# Patient Record
Sex: Male | Born: 1990 | Race: Black or African American | Hispanic: No | Marital: Single | Smoking: Current every day smoker
Health system: Southern US, Community
[De-identification: ages and names within clinical notes are randomized; demographics above are authoritative.]

## PROBLEM LIST (undated history)

## (undated) DIAGNOSIS — F319 Bipolar disorder, unspecified: Secondary | ICD-10-CM

## (undated) DIAGNOSIS — F209 Schizophrenia, unspecified: Secondary | ICD-10-CM

## (undated) DIAGNOSIS — M6282 Rhabdomyolysis: Secondary | ICD-10-CM

## (undated) HISTORY — PX: LEG SURGERY: SHX1003

---
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
## 2009-06-17 ENCOUNTER — Emergency Department (HOSPITAL_COMMUNITY): Admission: EM | Admit: 2009-06-17 | Discharge: 2009-06-18 | Payer: Self-pay | Admitting: Emergency Medicine

## 2010-12-13 LAB — URINE CULTURE
Colony Count: NO GROWTH
Culture: NO GROWTH

## 2010-12-13 LAB — URINALYSIS, ROUTINE W REFLEX MICROSCOPIC
Glucose, UA: 100 mg/dL — AB
Ketones, ur: 15 mg/dL — AB
Nitrite: NEGATIVE
Specific Gravity, Urine: >1.03 — ABNORMAL HIGH (ref 1.005–1.030)
pH: 6 (ref 5.0–8.0)

## 2010-12-13 LAB — RAPID URINE DRUG SCREEN, HOSP PERFORMED
Benzodiazepines: NOT DETECTED
Cocaine: NOT DETECTED
Opiates: NOT DETECTED
Tetrahydrocannabinol: POSITIVE — AB

## 2010-12-13 LAB — BASIC METABOLIC PANEL
BUN: 10 mg/dL (ref 6–23)
Calcium: 9.2 mg/dL (ref 8.4–10.5)
GFR calc non Af Amer: >60 mL/min (ref >60)
Potassium: 3.2 mEq/L — ABNORMAL LOW (ref 3.5–5.1)
Sodium: 135 mEq/L (ref 135–145)

## 2010-12-13 LAB — URINE MICROSCOPIC-ADD ON

## 2010-12-13 LAB — ETHANOL: Alcohol, Ethyl (B): <5 mg/dL (ref 0–10)

## 2010-12-13 LAB — CBC
HCT: 46.9 % (ref 39.0–52.0)
Hemoglobin: 16.1 g/dL (ref 13.0–17.0)
Platelets: 233 10*3/uL (ref 150–400)
RDW: 13.7 % (ref 11.5–15.5)
WBC: 6.3 10*3/uL (ref 4.0–10.5)

## 2010-12-13 LAB — DIFFERENTIAL
Basophils Absolute: 0.1 10*3/uL (ref 0.0–0.1)
Eosinophils Relative: 0 % (ref 0–5)
Lymphocytes Relative: 39 % (ref 12–46)
Lymphs Abs: 2.4 10*3/uL (ref 0.7–4.0)
Neutro Abs: 3.3 10*3/uL (ref 1.7–7.7)
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
## 2011-06-18 LAB — BASIC METABOLIC PANEL
BUN: 12
Calcium: 9.2
Creatinine, Ser: 1.11
GFR calc non Af Amer: >60
Glucose, Bld: 107 — ABNORMAL HIGH
Potassium: 4.2

## 2011-06-18 LAB — DIFFERENTIAL
Basophils Absolute: 0
Eosinophils Relative: 0
Lymphocytes Relative: 17
Lymphs Abs: 1.6
Neutro Abs: 6.9
Neutrophils Relative %: 76

## 2011-06-18 LAB — CBC
Platelets: 263
RDW: 13.6
WBC: 9.1

## 2011-08-11 ENCOUNTER — Emergency Department (HOSPITAL_COMMUNITY)
Admission: EM | Admit: 2011-08-11 | Discharge: 2011-08-11 | Discharge status: Against Medical Advice | Payer: Self-pay | Source: Home / Self Care

## 2012-03-14 ENCOUNTER — Emergency Department (HOSPITAL_COMMUNITY)
Admission: EM | Admit: 2012-03-14 | Discharge: 2012-03-14 | Disposition: A | Discharge status: Home / Self Care | Payer: Self-pay | Attending: Emergency Medicine | Admitting: Emergency Medicine

## 2012-03-14 ENCOUNTER — Encounter (HOSPITAL_COMMUNITY): Payer: Self-pay | Admitting: *Deleted

## 2012-03-14 DIAGNOSIS — F319 Bipolar disorder, unspecified: Secondary | ICD-10-CM | POA: Insufficient documentation

## 2012-03-14 DIAGNOSIS — F209 Schizophrenia, unspecified: Secondary | ICD-10-CM | POA: Insufficient documentation

## 2012-03-14 DIAGNOSIS — F410 Panic disorder [episodic paroxysmal anxiety] without agoraphobia: Secondary | ICD-10-CM | POA: Insufficient documentation

## 2012-03-14 DIAGNOSIS — F17200 Nicotine dependence, unspecified, uncomplicated: Secondary | ICD-10-CM | POA: Insufficient documentation

## 2012-03-14 HISTORY — DX: Bipolar disorder, unspecified: F31.9

## 2012-03-14 HISTORY — DX: Schizophrenia, unspecified: F20.9

## 2012-03-14 LAB — CBC
Hemoglobin: 13.8 g/dL (ref 13.0–17.0)
MCV: 91 fL (ref 78.0–100.0)
Platelets: 251 10*3/uL (ref 150–400)
RBC: 4.31 MIL/uL (ref 4.22–5.81)
WBC: 6.2 10*3/uL (ref 4.0–10.5)

## 2012-03-14 LAB — RAPID URINE DRUG SCREEN, HOSP PERFORMED
Amphetamines: NOT DETECTED
Benzodiazepines: NOT DETECTED
Opiates: NOT DETECTED
Tetrahydrocannabinol: POSITIVE — AB

## 2012-03-14 LAB — BASIC METABOLIC PANEL
CO2: 26 mEq/L (ref 19–32)
Calcium: 9.3 mg/dL (ref 8.4–10.5)
Chloride: 101 mEq/L (ref 96–112)
Glucose, Bld: 146 mg/dL — ABNORMAL HIGH (ref 70–99)
Potassium: 3.5 mEq/L (ref 3.5–5.1)
Sodium: 135 mEq/L (ref 135–145)

## 2012-03-14 LAB — ETHANOL: Alcohol, Ethyl (B): <11 mg/dL (ref 0–11)

## 2012-03-14 MED ORDER — LORAZEPAM 1 MG PO TABS
1.0000 mg | ORAL_TABLET | Freq: Once | ORAL | Status: AC
  Administered 2012-03-14: 1 mg via ORAL
  Filled 2012-03-14: qty 1

## 2012-03-14 MED ORDER — DIPHENHYDRAMINE HCL 25 MG PO CAPS: 50.0000 mg | ORAL_CAPSULE | Freq: Every day | ORAL | Status: DC

## 2012-03-14 MED ORDER — HALOPERIDOL 1 MG PO TABS: 1.0000 mg | ORAL_TABLET | Freq: Every day | ORAL | Status: DC

## 2012-03-14 NOTE — ED Notes (Signed)
Pt states he thinks he may be having panic attacks lately. Symptoms described as heart racing, headache, shaking, decreased appetite, shaking at times. Pt states he has been under a lot of stress lately.

## 2012-03-14 NOTE — BH Assessment (Signed)
Assessment Note   Nathaniel Holt is an 21 y.o. male. Patient voluntarily came into the emergency room seeking assistance due to increased anxiety and stress. He reports that he is not sleeping very well, falling asleep around 3 AM and then waking up around 5 or 6 AM. He reports having dreams in which he is being chased or people are watching him. He also reports a decrease in appetite. He does not want to eat dinner when he returns home at night from work. Family reports he is a little more paranoid, jumping up when there are loud noises and restless. He does have a previous inpatient history at [facility], about 2 years ago, where he was diagnosed with schizophrenia/bipolar d/o per mother. He stated they gave him some medication and he took it for a while, but then he was feeling better, so he stopped taking it. Patient has had some significant stressors recently, including the start of a new, 40 hour per week job, a new baby, which is 2 months old and moving in the past month. He denies suicidal or homicidal ideation. He denies hearing any voices or having any type of visual hallucinations. However, family reports he is more hypervigilant, looking around more and over all just, jumpy.   Education was provided about his mental health diagnosis and the need to be compliant with medications. He does not have medical insurance. Patient is willing to begin medication again. Tele-psych is being requested so patient can possibly be started on a generic anti-psychotic medication and an outpatient referral will be made for the patient to see [facility] next week for follow up.   Axis I: Chronic Paranoid Schizophrenia by history Axis II: Deferred Axis III Leg Surgery Axis IV: Significant recent stressors: new baby, new job and relocation Axis V: 60  Past Medical History:  Past Medical History  Diagnosis Date  . Bipolar disorder   . Schizophrenia     Past Surgical History  Procedure  Date  . Leg surgery     Family History: No family history on file.  Social History:  reports that he has been smoking.  He does not have any smokeless tobacco history on file. He reports that he does not drink alcohol or use illicit drugs.  Additional Social History:     CIWA: CIWA-Ar BP: 137/90 mmHg Pulse Rate: 92  COWS:    Allergies: No Known Allergies  Home Medications:  (Not in a hospital admission)  OB/GYN Status:  No LMP for male patient.  General Assessment Data Location of Assessment: [facility] Assessment: Yes Living Arrangements: Spouse/significant other;Children Can pt return to current living arrangement?: Yes Admission Status: Voluntary Is patient capable of signing voluntary admission?: Yes Transfer from: Home Referral Source: MD  Education Status Is patient currently in school?: No  Risk to self Suicidal Ideation: No Suicidal Intent: No Is patient at risk for suicide?: No Suicidal Plan?: No Access to Means: No What has been your use of drugs/alcohol within the last 12 months?:  (denies) Previous Attempts/Gestures: No Intentional Self Injurious Behavior: None Family Suicide History: No Recent stressful life event(s): Other (Comment) (GF just had baby; just moved to [place]) Persecutory voices/beliefs?: No Depression: No Depression Symptoms: Insomnia;Loss of interest in usual pleasures Substance abuse history and/or treatment for substance abuse?: Yes (hx of THC and ETOH use) Suicide prevention information given to non-admitted patients: Yes  Risk to Others Homicidal Ideation: No Thoughts of Harm to Others: No Current Homicidal Intent:  No Current Homicidal Plan: No Access to Homicidal Means: No History of harm to others?: No Assessment of Violence: None Noted Does patient have access to weapons?: No Criminal Charges Pending?: No Does patient have a court date: No  Psychosis Hallucinations: None noted Delusions: Unspecified  Mental  Status Report Appear/Hygiene: Improved Eye Contact: Fair Motor Activity: Freedom of movement;Restlessness Speech: Logical/coherent;Soft Level of Consciousness: Alert;Restless Mood: Anxious;Preoccupied Affect: Anxious;Blunted;Preoccupied Anxiety Level: Minimal Thought Processes: Relevant Judgement: Unimpaired Orientation: Person;Place;Time;Situation;Appropriate for developmental age Obsessive Compulsive Thoughts/Behaviors: Minimal  Cognitive Functioning Concentration: Decreased Memory: Recent Intact;Remote Intact IQ: Average Insight: Poor Impulse Control: Fair Appetite: Poor Sleep: Decreased Total Hours of Sleep:  (2)  ADLScreening [facility]) Patient's cognitive ability adequate to safely complete daily activities?: Yes Patient able to express need for assistance with ADLs?: Yes Independently performs ADLs?: Yes  Abuse/Neglect [facility]) Physical Abuse: Denies Verbal Abuse: Denies Sexual Abuse: Denies  Prior Inpatient Therapy Prior Inpatient Therapy: Yes Prior Therapy Dates:  (2010) Prior Therapy Facilty/Provider(s):  ([facility]) Reason for Treatment:  (Psychosis)  Prior Outpatient Therapy Prior Outpatient Therapy: No  ADL Screening (condition at time of admission) Patient's cognitive ability adequate to safely complete daily activities?: Yes Patient able to express need for assistance with ADLs?: Yes Independently performs ADLs?: Yes       Abuse/Neglect Assessment (Assessment to be complete while patient is alone) Physical Abuse: Denies Verbal Abuse: Denies Sexual Abuse: Denies Values / Beliefs Cultural Requests During Hospitalization: None Spiritual Requests During Hospitalization: None        Additional Information 1:1 In Past 12 Months?: No CIRT Risk: No Elopement Risk: No Does patient have medical clearance?: Yes     Disposition:  Disposition Disposition of Patient: Outpatient treatment Type of outpatient treatment: Adult  On  Site Evaluation by:  Dr. Clarene Duke Reviewed with Physician:  Dr. Clarene Duke  Patient will be seen by tele-psychiatrist. Follow-up with [facility] for monitoring and mediation management.   Shon Baton H 03/14/2012 5:20 PM

## 2012-03-14 NOTE — ED Notes (Signed)
Pt stable at discharge Denies HI and SI thoughts Understands follow up procedures

## 2012-03-14 NOTE — ED Notes (Signed)
Tele-psych consult in process.

## 2012-03-14 NOTE — ED Provider Notes (Signed)
History   This chart was scribed for Nathaniel Anger, DO by Charolett Bumpers . The patient was seen in room APA01/APA01.    CSN: [id number]  Arrival date & time 03/14/12  1524   First MD Initiated Contact with Patient 03/14/12 1614      Chief Complaint  Patient presents with  . Panic Attack     HPI Pt was seen at 1527. Nathaniel Holt is a 21 y.o. male who presents to the Emergency Department complaining of gradual onset and persistence of intermittent "anxiety" and "panic attacks" for the past 5 days. Patient describes his symptoms as feeling "shakey," palpatations, decreased appetite, and insomnia. Patient states that he "has been under a lot of stress lately."  Family member states pt goes to work during the day, but when he gets home, and on his days off, he is "paranoid" that "someone is watching him," and "jumpy."  No auditory or visual hallucinations, no SI/HI.  Last psych admit in 2010 to [facility], and he followed up with [facility] for a short period of time afterwards.  Pt has since stopped going to see his mental health counselor and stopped taking his meds.  Denies any other complaints.     Past Medical History  Diagnosis Date  . Bipolar disorder   . Schizophrenia     Past Surgical History  Procedure Date  . Leg surgery     History  Substance Use Topics  . Smoking status: Current Everyday Smoker  . Smokeless tobacco: Not on file  . Alcohol Use: No    Review of Systems ROS: Statement: All systems negative except as marked or noted in the HPI; Constitutional: Negative for fever and chills. ; ; Eyes: Negative for eye pain, redness and discharge. ; ; ENMT: Negative for ear pain, hoarseness, nasal congestion, sinus pressure and sore throat. ; ; Cardiovascular: Negative for chest pain, palpitations, diaphoresis, dyspnea and peripheral edema. ; ; Respiratory: Negative for cough, wheezing and stridor. ; ; Gastrointestinal: Negative for nausea, vomiting,  diarrhea, abdominal pain, blood in stool, hematemesis, jaundice and rectal bleeding. . ; ; Genitourinary: Negative for dysuria, flank pain and hematuria. ; ; Musculoskeletal: Negative for back pain and neck pain. Negative for swelling and trauma.; ; Skin: Negative for pruritus, rash, abrasions, blisters, bruising and skin lesion.; ; Neuro: Negative for headache, lightheadedness and neck stiffness. Negative for weakness, altered level of consciousness , altered mental status, extremity weakness, paresthesias, involuntary movement, seizure and syncope.; Psych:  +anxiety. No SI, no SA, no HI, no hallucinations.      Allergies  Review of patient's allergies indicates no known allergies.  Home Medications  No current outpatient prescriptions on file.  BP 137/90  Pulse 92  Temp 99.1 F (37.3 C) (Oral)  Resp 18  Ht 5\' 9"  (1.753 m)  Wt 155 lb (70.308 kg)  BMI 22.89 kg/m2  SpO2 99%  Physical Exam 1630: Physical examination:  Nursing notes reviewed; Vital signs and O2 SAT reviewed;  Constitutional: Well developed, Well nourished, Well hydrated, In no acute distress; Head:  Normocephalic, atraumatic; Eyes: EOMI, PERRL, No scleral icterus; ENMT: Mouth and pharynx normal, Mucous membranes moist; Neck: Supple, Full range of motion, No lymphadenopathy; Cardiovascular: Regular rate and rhythm, No murmur, rub, or gallop; Respiratory: Breath sounds clear & equal bilaterally, No rales, rhonchi, wheezes.  Speaking full sentences with ease, Normal respiratory effort/excursion; Chest: Nontender, Movement normal; Abdomen: Soft, Nontender, Nondistended, Normal bowel sounds;; Extremities: Pulses normal, No tenderness, No edema,  No calf edema or asymmetry.; Neuro: AA&Ox3, Major CN grossly intact.  Speech clear. No gross focal motor or sensory deficits in extremities.; Skin: Color normal, Warm, Dry.; Psych:  Affect flat, poor eye contact. Denies SI, no hallucinations.    ED Course  Procedures    MDM   MDM Reviewed: previous chart, nursing note and vitals Interpretation: labs   Results for orders placed during the hospital encounter of 03/14/12  BASIC METABOLIC PANEL      Component Value Range   Sodium 135  135 - 145 mEq/L   Potassium 3.5  3.5 - 5.1 mEq/L   Chloride 101  96 - 112 mEq/L   CO2 26  19 - 32 mEq/L   Glucose, Bld 146 (*) 70 - 99 mg/dL   BUN 7  6 - 23 mg/dL   Creatinine, Ser 1.61  0.50 - 1.35 mg/dL   Calcium 9.3  8.4 - 09.6 mg/dL   GFR calc non Af Amer >90  >90 mL/min   GFR calc Af Amer >90  >90 mL/min  CBC      Component Value Range   WBC 6.2  4.0 - 10.5 K/uL   RBC 4.31  4.22 - 5.81 MIL/uL   Hemoglobin 13.8  13.0 - 17.0 g/dL   HCT 04.5  40.9 - 81.1 %   MCV 91.0  78.0 - 100.0 fL   MCH 32.0  26.0 - 34.0 pg   MCHC 35.2  30.0 - 36.0 g/dL   RDW 91.4  78.2 - 95.6 %   Platelets 251  150 - 400 K/uL  URINE RAPID DRUG SCREEN (HOSP PERFORMED)      Component Value Range   Opiates NONE DETECTED  NONE DETECTED   Cocaine NONE DETECTED  NONE DETECTED   Benzodiazepines NONE DETECTED  NONE DETECTED   Amphetamines NONE DETECTED  NONE DETECTED   Tetrahydrocannabinol POSITIVE (*) NONE DETECTED   Barbiturates NONE DETECTED  NONE DETECTED  ETHANOL      Component Value Range   Alcohol, Ethyl (B) <11  0 - 11 mg/dL     2130:  [facility] Felissa has eval, no criteria to admit at this time; will have pt f/u [facility] on Wednesday.  Telepsych MD Trisha Mangle has also eval pt: no criteria to admit at this time, pt is not suicidal/homicidal or actively hallucinating; recommends d/c home with outpt f/u as arranged by [facility], recommends to start benadryl 50mg  PO qpm and haldol 1mg  PO qpm.  Dx testing d/w pt and family.  Questions answered.  Verb understanding, agreeable to d/c home with outpt f/u.         I personally performed the services described in this documentation, which was scribed in my presence. The recorded information has been reviewed and considered. Bryndon Cumbie  Allison Quarry, DO 03/16/12 1947

## 2014-06-17 ENCOUNTER — Emergency Department (HOSPITAL_COMMUNITY)
Admission: EM | Admit: 2014-06-17 | Discharge: 2014-06-17 | Disposition: A | Discharge status: Home / Self Care | Payer: Self-pay | Attending: Emergency Medicine | Admitting: Emergency Medicine

## 2014-06-17 ENCOUNTER — Encounter (HOSPITAL_COMMUNITY): Payer: Self-pay | Admitting: Emergency Medicine

## 2014-06-17 ENCOUNTER — Emergency Department (HOSPITAL_COMMUNITY): Payer: Self-pay

## 2014-06-17 DIAGNOSIS — M79604 Pain in right leg: Secondary | ICD-10-CM | POA: Insufficient documentation

## 2014-06-17 DIAGNOSIS — Z720 Tobacco use: Secondary | ICD-10-CM | POA: Insufficient documentation

## 2014-06-17 DIAGNOSIS — Z8659 Personal history of other mental and behavioral disorders: Secondary | ICD-10-CM | POA: Insufficient documentation

## 2014-06-17 DIAGNOSIS — R609 Edema, unspecified: Secondary | ICD-10-CM

## 2014-06-17 DIAGNOSIS — M7989 Other specified soft tissue disorders: Secondary | ICD-10-CM | POA: Insufficient documentation

## 2014-06-17 MED ORDER — IBUPROFEN 800 MG PO TABS
800.0000 mg | ORAL_TABLET | Freq: Once | ORAL | Status: AC
  Administered 2014-06-17: 800 mg via ORAL
  Filled 2014-06-17: qty 1

## 2014-06-17 MED ORDER — IBUPROFEN 600 MG PO TABS: 600.0000 mg | ORAL_TABLET | Freq: Four times a day (QID) | ORAL | Status: DC | PRN

## 2014-06-17 NOTE — ED Provider Notes (Signed)
Medical screening examination/treatment/procedure(s) were performed by non-physician practitioner and as supervising physician I was immediately available for consultation/collaboration.   EKG Interpretation None        [name], MD 06/17/14 1446

## 2014-06-17 NOTE — ED Notes (Signed)
Pain rt lower leg,x 1 week
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
## 2014-06-17 NOTE — Discharge Instructions (Signed)
Musculoskeletal Pain °Musculoskeletal pain is muscle and boney aches and pains. These pains can occur in any part of the body. Your caregiver may treat you without knowing the cause of the pain. They may treat you if blood or urine tests, X-rays, and other tests were normal.  °CAUSES °There is often not a definite cause or reason for these pains. These pains may be caused by a type of germ (virus). The discomfort may also come from overuse. Overuse includes working out too hard when your body is not fit. Boney aches also come from weather changes. Bone is sensitive to atmospheric pressure changes. °HOME CARE INSTRUCTIONS  °· Ask when your test results will be ready. Make sure you get your test results. °· Only take over-the-counter or prescription medicines for pain, discomfort, or fever as directed by your caregiver. If you were given medications for your condition, do not drive, operate machinery or power tools, or sign legal documents for 24 hours. Do not drink alcohol. Do not take sleeping pills or other medications that may interfere with treatment. °· Continue all activities unless the activities cause more pain. When the pain lessens, slowly resume normal activities. Gradually increase the intensity and duration of the activities or exercise. °· During periods of severe pain, bed rest may be helpful. Lay or sit in any position that is comfortable. °· Putting ice on the injured area. °¨ Put ice in a bag. °¨ Place a towel between your skin and the bag. °¨ Leave the ice on for 15 to 20 minutes, 3 to 4 times a day. °· Follow up with your caregiver for continued problems and no reason can be found for the pain. If the pain becomes worse or does not go away, it may be necessary to repeat tests or do additional testing. Your caregiver may need to look further for a possible cause. °SEEK IMMEDIATE MEDICAL CARE IF: °· You have pain that is getting worse and is not relieved by medications. °· You develop chest pain  that is associated with shortness or breath, sweating, feeling sick to your stomach (nauseous), or throw up (vomit). °· Your pain becomes localized to the abdomen. °· You develop any new symptoms that seem different or that concern you. °MAKE SURE YOU:  °· Understand these instructions. °· Will watch your condition. °· Will get help right away if you are not doing well or get worse. °Document Released: 08/26/2005 Document Revised: 11/18/2011 Document Reviewed: 04/30/2013 °ExitCare® Patient Information ©2015 [employer]. This information is not intended to replace advice given to you by your health care provider. Make sure you discuss any questions you have with your health care provider. ° ° ° ° °Emergency Department Resource Guide °1) Find a Doctor and Pay Out of Pocket °Although you won't have to find out who is covered by your insurance plan, it is a good idea to ask around and get recommendations. You will then need to call the office and see if the doctor you have chosen will accept you as a new patient and what types of options they offer for patients who are self-pay. Some doctors offer discounts or will set up payment plans for their patients who do not have insurance, but you will need to ask so you aren't surprised when you get to your appointment. ° °2) Contact Your Local Health Department °Not all health departments have doctors that can see patients for sick visits, but many do, so it is worth a call to see if   yours does. If you don't know where your local health department is, you can check in your phone book. The CDC also has a tool to help you locate your state's health department, and many state websites also have listings of all of their local health departments. ° °3) Find a Walk-in Clinic °If your illness is not likely to be very severe or complicated, you may want to try a walk in clinic. These are popping up all over the country in pharmacies, drugstores, and shopping centers. They're usually  staffed by nurse practitioners or physician assistants that have been trained to treat common illnesses and complaints. They're usually fairly quick and inexpensive. However, if you have serious medical issues or chronic medical problems, these are probably not your best option. ° °No Primary Care Doctor: °- Call Health Connect at  832-8000 - they can help you locate a primary care doctor that  accepts your insurance, provides certain services, etc. °- Physician Referral Service- 1-800-533-3463 ° °Chronic Pain Problems: °Organization         Address  Phone   Notes  °[facility]  ([phone]) [phone] Patients need to be referred by their primary care doctor.  ° °Medication Assistance: °Organization         Address  Phone   Notes  °[place] [facility] [address] °[place], [place] [address] (336) 641-8030 --Must be a resident of [place] °-- Must have NO insurance coverage whatsoever (no Medicaid/ Medicare, etc.) °-- The pt. MUST have a primary care doctor that directs their care regularly and follows them in the community °  °[facility]  (866) 331-1348   °United Way  (888) 892-1162   ° °Agencies that provide inexpensive medical care: °Organization         Address  Phone   Notes  °[facility]  (336) 832-8035   °[facility]    (336) 832-7272   °[facility] [address] °[place], [place] [address] (336) 832-4777   °[facility] [address], °[place] (336) 271-4999   °[facility]    (336) 373-0678   °[facility]    (336) 272-1050   °[facility] ° [address], [place] Phone:  (336) 832-4444, Fax:  (336) 832-4440 Hours of Operation:  9 am - 6 pm, M-F.  Also accepts Medicaid/Medicare and self-pay.  °[facility] ° [address], [place] Phone: (336) [phone], Fax: (336) [phone]. Hours of  Operation:  8:30 am - 5:30 pm, M-F.  Also accepts Medicaid and self-pay.  °[facility] [place] [address], [place] Phone: (336) 878-6027   °[facility] [address], [place], [place] (336)723-1848, Ext. 123 Mondays & Thursdays: 7-9 AM.  First 15 patients are seen on a first come, first serve basis. °  ° °Medicaid-accepting [place] Providers: ° °Organization         Address  Phone   Notes  °[facility] [address], [place] ([phone]) [phone] Also accepts self-pay patients.  °[facility] [address], [place] ° (336) [phone]   °[facility] [address], [place] (336) 288-8857   °[facility] [address] [place] [address], [place] (336) 299-7000   °Veita Bland [address], [place]  ° (336)   [phone] Only accepts [place] Access Medicaid patients after they have their name applied to their card.  ° °Self-Pay (no insurance) in [place]: ° °Organization         Address  Phone   Notes  °Sickle Cell Patients, [facility] [address], [place] (336) [phone]   °[facility] [address], [place] (336) 832-4400   °[facility] ° [address], [place] (336) 992-4800   °[facility]/Dr. Osei-Bonsu ° [address] [place] [address], [place] or [address], [place] (336) [phone] Phone number for both [place] and [place] locations is the same.  °[facility] [address], [place] (336) 299-0000   °[facility] [address] [place] [address], [place] or [address] (336) 852-7530 °(336) 878-2260   °[facility] [address], [place] (336) 350-1642, phone; (336) 294-5005, fax Sees patients 1st and 3rd Saturday of every month.  Must not qualify for public or private insurance (i.e. Medicaid, Medicare,  [place] Health Choice, Veterans' Benefits) • Household income should be no more than 200% of the poverty level •The clinic cannot treat you if you are pregnant or think you are pregnant • Sexually transmitted diseases are not treated at the clinic.  ° ° °Dental Care: °Organization         Address  Phone  Notes  °[place] [facility] [address], [place] (336) 641-6152 Accepts children up to age 21 who are enrolled in Medicaid or [place] Health Choice; pregnant women with a Medicaid card; and children who have applied for Medicaid or [place] Health Choice, but were declined, whose parents can pay a reduced fee at time of service.  °[place] [place]  [address], [place] (336) 641-7733 Accepts children up to age 21 who are enrolled in Medicaid or [place] Health Choice; pregnant women with a Medicaid card; and children who have applied for Medicaid or [place] Health Choice, but were declined, whose parents can pay a reduced fee at time of service.  °[facility] ° [address], [place] (336) 641-4533 Patients are seen by appointment only. Walk-ins are not accepted. [facility] will see patients 18 years of age and older. °Monday - Tuesday (8am-5pm) °Most Wednesdays (8:30-5pm) °$30 per visit, cash only  °[facility] ° [address], [place] (336) [phone] Patients are seen by appointment only. Walk-ins are not accepted. [facility] will see patients 18 years of age and older. °One Wednesday Evening (Monthly: Volunteer Based).  $30 per visit, cash only  °[facility]  ([phone]) [phone] for adults; Children under age 4, call Graduate Pediatric Dentistry at ([phone]) [phone]. Children aged 4-14, please call ([phone]) [phone] to request a pediatric application. ° Dental services are provided in all areas of dental care including fillings, crowns and bridges,  complete and partial dentures, implants, gum treatment, root canals, and extractions. Preventive care is also provided. Treatment is provided to both adults and children. °Patients are selected via a lottery and there is often a waiting list. °  °[facility] [address], ° ° (336) 763-8833 [email or web address] °  °[facility] [address], [place], [place] (336)723-1848, Ext. 123 Second and Fourth Thursday of each month, opens at 6:30 AM;   Clinic ends at 9 AM.  Patients are seen on a first-come first-served basis, and a limited number are seen during each clinic.   [facility]  [address] Hillard Danker [place], [place] [phone]   Eligibility Requirements You must have lived in [place], [place], or [place] counties for at least the last three months.   You cannot be eligible for state or federal sponsored [employer], including [employer], [place], or [employer].   You generally cannot be eligible for healthcare insurance through your employer.    How to apply: Eligibility screenings are held every Tuesday and Wednesday afternoon from 1:00 pm until 4:00 pm. You do not need an appointment for the interview!  [facility] [address] [place], [place]   [place]  [place] Department  [place]  [phone]    Behavioral Health Resources in the Community: Intensive Outpatient Programs Organization         Address  Phone  Notes  [place]. [address] [place], [place] [phone])[phone]   [facility] [address], [place], [place]   [facility]: [facility] [address], [place], [place]   [place] [address],  [place], [place] or (251)778-4510   Substance Abuse Resources Organization          Address  Phone  Notes  [facility]  (540)363-8552   [place]  (567)824-8078   [facility] [place]   Chinita Pester  [phone]   Residential & Outpatient Substance Abuse Program  (204) 443-1832   Psychological Services Organization         Address  Phone  Notes  [facility] [place]  [place]  [phone]   [place] [address] [place] or [phone]    Mobile Crisis Teams Organization         Address  Phone  Notes  [facility], Mobile Crisis Care Unit  (443) 402-2388   [facility]  [address] [place], [place]   [place] [address], [place] [phone]    Self-Help/Support Groups Organization         Address  Phone             Notes  [place]. of [place] - variety of support groups  [place] Call for more information  Narcotics Anonymous (NA), [facility] [address], [employer] [place]  2 meetings at this location   Special educational needs teacher         Address  Phone  Notes  [facility] [place],    [place]  [phone]   [facility]  [address] [place] [address]1224, [place], [place]   [place], [place] [phone] Admissions: 8am-3pm M-F  [facility] [place] [address]    [place], [place] [phone]   [facility] [address] Jadene Pierini [place], [place]   [facility] [address],  [place], [place]   [facility] - Intensive Outpatient [address] Kristeen [address], [place], [place]   [facility] ([place] [address] [place], [place] or [phone]  [facility] ([facility]) [address] [place], [place]  [phone] Accepts Medicaid  [facility] [address]  [place] [phone] Substance Abuse/Addiction Treatment   [facility] Resources Organization         Address  Phone  Notes  [facility]  [phone]) [phone]   Angie FavaJulie Brannon, PhD [address] [place], [place]   [phone](336) [phone] or ([phone](336) [phone]   [facility]   [address] [place], [place] [phone](336) [phone]   [facility] [address], [place], [place] [phone]) [phone] Insurance/Medicaid/sponsorship through [facility]  [facility] [address]                                    [place], [place] [phone]) [phone] Therapy/tele-psych/case  [facility] [address]   [place], [place] [phone]) [phone]    Dr. Lolly MustacheArfeen  [phone]) [phone]   [facility]  United [facility] 1) [address],  2) [address], [place] 3)  [address], [place] [phone](336) [phone](336) ([phone]  [phone](336) [phone]   [facility] Child Abuse Hotline 825-349-3370(336) 517-725-6717 or [phone](336) [phone] (After Hours)      Use the medicine prescribed along with a heating pad applied to your lower leg.  Elevate and try to avoid prolonged standing or other activity that worsens pain.  Return here for any worsening symptoms.  Your ultrasound is negative today - there is no sign of a blood clot as the source of your symptoms.
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
## 2016-07-17 ENCOUNTER — Emergency Department (HOSPITAL_COMMUNITY)
Admission: EM | Admit: 2016-07-17 | Discharge: 2016-07-17 | Disposition: A | Discharge status: Home / Self Care | Payer: No Typology Code available for payment source | Attending: Emergency Medicine | Admitting: Emergency Medicine

## 2016-07-17 ENCOUNTER — Encounter (HOSPITAL_COMMUNITY): Payer: Self-pay | Admitting: Emergency Medicine

## 2016-07-17 ENCOUNTER — Emergency Department (HOSPITAL_COMMUNITY): Payer: No Typology Code available for payment source

## 2016-07-17 DIAGNOSIS — Y999 Unspecified external cause status: Secondary | ICD-10-CM | POA: Diagnosis not present

## 2016-07-17 DIAGNOSIS — R0781 Pleurodynia: Secondary | ICD-10-CM

## 2016-07-17 DIAGNOSIS — Y92481 Parking lot as the place of occurrence of the external cause: Secondary | ICD-10-CM | POA: Insufficient documentation

## 2016-07-17 DIAGNOSIS — Y939 Activity, unspecified: Secondary | ICD-10-CM | POA: Diagnosis not present

## 2016-07-17 DIAGNOSIS — S59911A Unspecified injury of right forearm, initial encounter: Secondary | ICD-10-CM | POA: Diagnosis present

## 2016-07-17 DIAGNOSIS — S5011XA Contusion of right forearm, initial encounter: Secondary | ICD-10-CM | POA: Diagnosis not present

## 2016-07-17 DIAGNOSIS — F17210 Nicotine dependence, cigarettes, uncomplicated: Secondary | ICD-10-CM | POA: Insufficient documentation

## 2016-07-17 MED ORDER — IBUPROFEN 800 MG PO TABS
800.0000 mg | ORAL_TABLET | Freq: Once | ORAL | Status: AC
  Administered 2016-07-17: 800 mg via ORAL
  Filled 2016-07-17: qty 1
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
## 2017-06-24 ENCOUNTER — Emergency Department (HOSPITAL_COMMUNITY)
Admission: EM | Admit: 2017-06-24 | Discharge: 2017-06-24 | Disposition: A | Discharge status: Home / Self Care | Payer: Self-pay | Attending: Emergency Medicine | Admitting: Emergency Medicine

## 2017-06-24 ENCOUNTER — Encounter (HOSPITAL_COMMUNITY): Payer: Self-pay | Admitting: *Deleted

## 2017-06-24 DIAGNOSIS — R112 Nausea with vomiting, unspecified: Secondary | ICD-10-CM | POA: Insufficient documentation

## 2017-06-24 DIAGNOSIS — R1115 Cyclical vomiting syndrome unrelated to migraine: Secondary | ICD-10-CM

## 2017-06-24 DIAGNOSIS — F17210 Nicotine dependence, cigarettes, uncomplicated: Secondary | ICD-10-CM | POA: Insufficient documentation

## 2017-06-24 MED ORDER — ONDANSETRON 8 MG PO TBDP: 8.0000 mg | ORAL_TABLET | Freq: Three times a day (TID) | ORAL | 0 refills | Status: DC | PRN

## 2017-06-24 MED ORDER — ONDANSETRON 8 MG PO TBDP
8.0000 mg | ORAL_TABLET | Freq: Once | ORAL | Status: AC
  Administered 2017-06-24: 8 mg via ORAL
  Filled 2017-06-24: qty 1

## 2017-06-24 NOTE — Discharge Instructions (Signed)

## 2017-06-24 NOTE — ED Provider Notes (Signed)
[facility] EMERGENCY DEPARTMENT Provider Note   CSN: [id number] Arrival date & time: 06/24/17  0409     History   Chief Complaint Chief Complaint  Patient presents with  . Emesis    HPI Nathaniel Holt is a 26 y.o. male.  The history is provided by the patient.  Emesis   This is a new problem. The current episode started 1 to 2 hours ago. The problem has been gradually worsening. There has been no fever. Associated symptoms include abdominal pain and chills. Pertinent negatives include no diarrhea.  pt reports soon after going to bed he had onset of nausea/vomiting No diarrhea but he feels this might start He has chills No significant abdominal pain He has had mild cough No other acute complaints No travel Past Medical History:  Diagnosis Date  . Bipolar disorder (HCC)   . Schizophrenia (HCC)     There are no active problems to display for this patient.   Past Surgical History:  Procedure Laterality Date  . LEG SURGERY         Home Medications    Prior to Admission medications   Medication Sig Start Date End Date Taking? Authorizing Provider  ibuprofen (ADVIL,MOTRIN) 600 MG tablet Take 1 tablet (600 mg total) by mouth every 6 (six) hours as needed. 06/17/14   Burgess Amor, PA-C    Family History History reviewed. No pertinent family history.  Social History Social History  Substance Use Topics  . Smoking status: Current Every Day Smoker    Packs/day: 1.00    Types: Cigarettes  . Smokeless tobacco: Never Used  . Alcohol use No     Allergies   Patient has no known allergies.   Review of Systems Review of Systems  Constitutional: Positive for chills.  Gastrointestinal: Positive for abdominal pain and vomiting. Negative for diarrhea.  All other systems reviewed and are negative.    Physical Exam Updated Vital Signs BP 116/86   Pulse 81   Temp 98.1 F (36.7 C)   Resp 18   Ht 1.753 m ( )   Wt 68 kg (150 lb)   SpO2 98%   BMI 22.15  kg/m   Physical Exam CONSTITUTIONAL: Well developed/well nourished HEAD: Normocephalic/atraumatic EYES: EOMI/PERRL ENMT: Mucous membranes moist NECK: supple no meningeal signs SPINE/BACK:entire spine nontender CV: S1/S2 noted, no murmurs/rubs/gallops noted LUNGS: Lungs are clear to auscultation bilaterally, no apparent distress ABDOMEN: soft, nontender, no rebound or guarding, bowel sounds noted throughout abdomen GU:no cva tenderness NEURO: Pt is awake/alert/appropriate, moves all extremitiesx4.  No facial droop.   EXTREMITIES: pulses normal/equal, full ROM SKIN: warm, color normal PSYCH: no abnormalities of mood noted, alert and oriented to situation   ED Treatments / Results  Labs (all labs ordered are listed, but only abnormal results are displayed) Labs Reviewed - No data to display  EKG  EKG Interpretation None       Radiology No results found.  Procedures Procedures (including critical care time)  Medications Ordered in ED Medications  ondansetron (ZOFRAN-ODT) disintegrating tablet 8 mg (8 mg Oral Given 06/24/17 0442)     Initial Impression / Assessment and Plan / ED Course  I have reviewed the triage vital signs and the nursing notes.      Pt stable He is well appearing Likely viral illness   No focal ABD tenderness No vomiting here He is appropriate for d/c home Final Clinical Impressions(s) / ED Diagnoses   Final diagnoses:  Non-intractable cyclical vomiting with nausea  New Prescriptions Discharge Medication List as of 06/24/2017  5:28 AM    START taking these medications   Details  ondansetron (ZOFRAN ODT) 8 MG disintegrating tablet Take 1 tablet (8 mg total) by mouth every 8 (eight) hours as needed., Starting Tue 06/24/2017, Print         Zadie Rhine, MD 06/24/17 [phone]

## 2017-06-24 NOTE — ED Triage Notes (Signed)
Pt c/o n/v that woke him up this evening around 0130

## 2017-08-10 ENCOUNTER — Emergency Department (HOSPITAL_COMMUNITY)
Admission: EM | Admit: 2017-08-10 | Discharge: 2017-08-10 | Disposition: A | Discharge status: Home / Self Care | Payer: No Typology Code available for payment source | Attending: Emergency Medicine | Admitting: Emergency Medicine

## 2017-08-10 ENCOUNTER — Encounter (HOSPITAL_COMMUNITY): Payer: Self-pay | Admitting: Emergency Medicine

## 2017-08-10 ENCOUNTER — Other Ambulatory Visit: Payer: Self-pay

## 2017-08-10 DIAGNOSIS — Y929 Unspecified place or not applicable: Secondary | ICD-10-CM | POA: Insufficient documentation

## 2017-08-10 DIAGNOSIS — F17210 Nicotine dependence, cigarettes, uncomplicated: Secondary | ICD-10-CM | POA: Diagnosis not present

## 2017-08-10 DIAGNOSIS — Y999 Unspecified external cause status: Secondary | ICD-10-CM | POA: Diagnosis not present

## 2017-08-10 DIAGNOSIS — S39012A Strain of muscle, fascia and tendon of lower back, initial encounter: Secondary | ICD-10-CM | POA: Insufficient documentation

## 2017-08-10 DIAGNOSIS — Z79899 Other long term (current) drug therapy: Secondary | ICD-10-CM | POA: Diagnosis not present

## 2017-08-10 DIAGNOSIS — Y939 Activity, unspecified: Secondary | ICD-10-CM | POA: Diagnosis not present

## 2017-08-10 DIAGNOSIS — S3992XA Unspecified injury of lower back, initial encounter: Secondary | ICD-10-CM | POA: Diagnosis present

## 2017-08-10 MED ORDER — METHOCARBAMOL 500 MG PO TABS: 500.0000 mg | ORAL_TABLET | Freq: Two times a day (BID) | ORAL | 0 refills | Status: DC

## 2017-08-10 MED ORDER — DICLOFENAC SODIUM 50 MG PO TBEC: 50.0000 mg | DELAYED_RELEASE_TABLET | Freq: Two times a day (BID) | ORAL | 0 refills | Status: DC
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
## 2017-08-10 NOTE — Discharge Instructions (Signed)
Return if any problems.

## 2020-01-21 ENCOUNTER — Encounter (HOSPITAL_COMMUNITY): Payer: Self-pay

## 2020-01-21 ENCOUNTER — Emergency Department (HOSPITAL_COMMUNITY)
Admission: EM | Admit: 2020-01-21 | Discharge: 2020-01-21 | Disposition: A | Discharge status: Home / Self Care | Payer: Self-pay | Attending: Emergency Medicine | Admitting: Emergency Medicine

## 2020-01-21 ENCOUNTER — Other Ambulatory Visit: Payer: Self-pay

## 2020-01-21 DIAGNOSIS — N3000 Acute cystitis without hematuria: Secondary | ICD-10-CM | POA: Insufficient documentation

## 2020-01-21 DIAGNOSIS — F17210 Nicotine dependence, cigarettes, uncomplicated: Secondary | ICD-10-CM | POA: Insufficient documentation

## 2020-01-21 DIAGNOSIS — Z79899 Other long term (current) drug therapy: Secondary | ICD-10-CM | POA: Insufficient documentation

## 2020-01-21 LAB — URINALYSIS, ROUTINE W REFLEX MICROSCOPIC
Bacteria, UA: NONE SEEN
Bilirubin Urine: NEGATIVE
Glucose, UA: NEGATIVE mg/dL
Hgb urine dipstick: NEGATIVE
Ketones, ur: NEGATIVE mg/dL
Leukocytes,Ua: NEGATIVE
Nitrite: POSITIVE — AB
Protein, ur: NEGATIVE mg/dL
Specific Gravity, Urine: 1.015 (ref 1.005–1.030)
pH: 6 (ref 5.0–8.0)

## 2020-01-21 MED ORDER — CEPHALEXIN 500 MG PO CAPS: 500.0000 mg | ORAL_CAPSULE | Freq: Two times a day (BID) | ORAL | 0 refills | Status: AC

## 2020-01-21 MED ORDER — CEPHALEXIN 500 MG PO CAPS
500.0000 mg | ORAL_CAPSULE | Freq: Once | ORAL | Status: AC
  Administered 2020-01-21: 500 mg via ORAL
  Filled 2020-01-21: qty 1

## 2020-01-21 NOTE — ED Triage Notes (Signed)
Pt has been having the urge to urinate more frequently. He [facility] for STD's and tested negative. He believes he has A uti

## 2020-01-21 NOTE — ED Provider Notes (Signed)
[facility] EMERGENCY DEPARTMENT Provider Note   CSN: [id number] Arrival date & time: 01/21/20  3016     History Chief Complaint  Patient presents with  . Urinary Frequency    Nathaniel Holt is a 29 y.o. male presenting for evaluation of dysuria and increased urinary frequency which started about 4 days ago.  He describes burning pain with urination in addition to increased urinary frequency.  He denies n/v, abdominal pain, fevers or chills, no flank pain.  He denies penile discharge. He [facility] [place] ytd and was screened for std's yesterday with negative results.  He states they were unable to run a UA but he reports distant hx of uti.  He is currently taking otc pyridium with significant improvement in his sx.  No personal or family history of kidney stones.   HPI     Past Medical History:  Diagnosis Date  . Bipolar disorder ([place])   . Schizophrenia ([place])     There are no problems to display for this patient.   Past Surgical History:  Procedure Laterality Date  . LEG SURGERY         No family history on file.  Social History   Tobacco Use  . Smoking status: Current Every Day Smoker    Packs/day: 1.00    Types: Cigarettes  . Smokeless tobacco: Never Used  Substance Use Topics  . Alcohol use: No  . Drug use: No    Home Medications Prior to Admission medications   Medication Sig Start Date End Date Taking? Authorizing Provider  cephALEXin (KEFLEX) 500 MG capsule Take 1 capsule (500 mg total) by mouth 2 (two) times daily for 7 days. 01/21/20 01/28/20  Evalee Jefferson, PA-C  diclofenac (VOLTAREN) 50 MG EC tablet Take 1 tablet (50 mg total) by mouth 2 (two) times daily. 08/10/17   Fransico Meadow, PA-C  ibuprofen (ADVIL,MOTRIN) 600 MG tablet Take 1 tablet (600 mg total) by mouth every 6 (six) hours as needed. 06/17/14   Evalee Jefferson, PA-C  methocarbamol (ROBAXIN) 500 MG tablet Take 1 tablet (500 mg total) by mouth 2 (two) times daily. 08/10/17   Fransico Meadow, PA-C  ondansetron (ZOFRAN ODT) 8 MG disintegrating tablet Take 1 tablet (8 mg total) by mouth every 8 (eight) hours as needed. 06/24/17   Ripley Fraise, MD    Allergies    Patient has no known allergies.  Review of Systems   Review of Systems  Constitutional: Negative for chills and fever.  HENT: Negative for congestion.   Eyes: Negative.   Respiratory: Negative for chest tightness and shortness of breath.   Cardiovascular: Negative for chest pain.  Gastrointestinal: Negative for abdominal pain and nausea.  Genitourinary: Positive for dysuria, frequency and urgency. Negative for discharge, flank pain, hematuria, penile pain, penile swelling and testicular pain.  Musculoskeletal: Negative for arthralgias, joint swelling and neck pain.  Skin: Negative.   Neurological: Negative for dizziness, weakness, light-headedness, numbness and headaches.  Psychiatric/Behavioral: Negative.     Physical Exam Updated Vital Signs BP (!) 126/92   Pulse 75   Temp 98.3 F (36.8 C)   Resp 12   Ht 5\' 9"  (1.753 m)   Wt 72.6 kg   SpO2 100%   BMI 23.63 kg/m   Physical Exam Vitals and nursing note reviewed.  Constitutional:      Appearance: He is well-developed.  HENT:     Head: Normocephalic and atraumatic.  Eyes:  Conjunctiva/sclera: Conjunctivae normal.  Cardiovascular:     Rate and Rhythm: Normal rate and regular rhythm.     Heart sounds: Normal heart sounds.  Pulmonary:     Effort: Pulmonary effort is normal.     Breath sounds: Normal breath sounds. No wheezing.  Abdominal:     General: Bowel sounds are normal. There is no distension.     Palpations: Abdomen is soft.     Tenderness: There is no abdominal tenderness. There is no right CVA tenderness, left CVA tenderness or guarding.  Musculoskeletal:        General: Normal range of motion.     Cervical back: Normal range of motion.  Skin:    General: Skin is warm and dry.  Neurological:     Mental Status: He is  alert.     ED Results / Procedures / Treatments   Labs (all labs ordered are listed, but only abnormal results are displayed) Labs Reviewed  URINALYSIS, ROUTINE W REFLEX MICROSCOPIC - Abnormal; Notable for the following components:      Result Value   Color, Urine AMBER (*)    Nitrite POSITIVE (*)    All other components within normal limits  URINE CULTURE  GC/CHLAMYDIA PROBE AMP ([place]) NOT AT [facility]    EKG None  Radiology No results found.  Procedures Procedures (including critical care time)  Medications Ordered in ED Medications  cephALEXin (KEFLEX) capsule 500 mg (has no administration in time range)    ED Course  I have reviewed the triage vital signs and the nursing notes.  Pertinent labs & imaging results that were available during my care of the patient were reviewed by me and considered in my medical decision making (see chart for details).    MDM Rules/Calculators/A&P                      Patient with dysuria and increased urinary frequency, classic for simple UTI.  He is nitrite positive today suggesting that this is a UTI.  He had a negative GC chlamydia test yesterday and he denies risk factors for STDs, denies penile discharge.  GC chlamydia was recollected today to confirm yesterday's test results, but will defer to this culture results.  He was started on Keflex with first dose given here.  Encouraged increased fluid intake.  Return precautions were outlined including fevers, nausea or vomiting, worsened symptoms. Final Clinical Impression(s) / ED Diagnoses Final diagnoses:  Acute cystitis without hematuria    Rx / DC Orders ED Discharge Orders         Ordered    cephALEXin (KEFLEX) 500 MG capsule  2 times daily     01/21/20 1029           Burgess Amor, PA-C 01/21/20 1034    Long, Arlyss Repress, MD 01/24/20 [phone]

## 2020-01-21 NOTE — Discharge Instructions (Addendum)
Your symptoms and labs today suggest that you do have a urinary infection.  Take your next dose of the antibiotic this evening.  Make sure you are drinking plenty of fluids.  Get rechecked if your symptoms persist or worsen (including any development of fever, nausea or vomiting).  A urine culture has been sent which will help [place] if your symptoms do not improve with the antibiotic chosen.  Also, a gonorrhea and chlamydia test has been sent from your urine sample to ensure your test results from yesterday were correct.

## 2020-01-23 LAB — URINE CULTURE: Culture: <10000 — AB

## 2020-01-24 LAB — GC/CHLAMYDIA PROBE AMP (~~LOC~~) NOT AT ARMC
Chlamydia: NEGATIVE
Comment: NEGATIVE
Comment: NORMAL
Neisseria Gonorrhea: NEGATIVE

## 2020-02-10 ENCOUNTER — Encounter (HOSPITAL_COMMUNITY): Payer: Self-pay | Admitting: Emergency Medicine

## 2020-02-10 ENCOUNTER — Emergency Department (HOSPITAL_COMMUNITY): Payer: Self-pay

## 2020-02-10 ENCOUNTER — Inpatient Hospital Stay (HOSPITAL_COMMUNITY)
Admission: EM | Admit: 2020-02-10 | Discharge: 2020-02-13 | DRG: 558 | Discharge status: Against Medical Advice | Payer: Self-pay | Attending: Internal Medicine | Admitting: Internal Medicine

## 2020-02-10 ENCOUNTER — Other Ambulatory Visit: Payer: Self-pay

## 2020-02-10 DIAGNOSIS — F17210 Nicotine dependence, cigarettes, uncomplicated: Secondary | ICD-10-CM | POA: Diagnosis present

## 2020-02-10 DIAGNOSIS — S29011A Strain of muscle and tendon of front wall of thorax, initial encounter: Secondary | ICD-10-CM | POA: Diagnosis present

## 2020-02-10 DIAGNOSIS — R072 Precordial pain: Secondary | ICD-10-CM

## 2020-02-10 DIAGNOSIS — Y93B2 Activity, push-ups, pull-ups, sit-ups: Secondary | ICD-10-CM

## 2020-02-10 DIAGNOSIS — M6282 Rhabdomyolysis: Principal | ICD-10-CM | POA: Diagnosis present

## 2020-02-10 DIAGNOSIS — F209 Schizophrenia, unspecified: Secondary | ICD-10-CM | POA: Diagnosis present

## 2020-02-10 DIAGNOSIS — Z20822 Contact with and (suspected) exposure to covid-19: Secondary | ICD-10-CM | POA: Diagnosis present

## 2020-02-10 LAB — CBC
HCT: 39.6 % (ref 39.0–52.0)
Hemoglobin: 13.3 g/dL (ref 13.0–17.0)
MCH: 32 pg (ref 26.0–34.0)
MCHC: 33.6 g/dL (ref 30.0–36.0)
MCV: 95.4 fL (ref 80.0–100.0)
Platelets: 229 10*3/uL (ref 150–400)
RBC: 4.15 MIL/uL — ABNORMAL LOW (ref 4.22–5.81)
RDW: 13.2 % (ref 11.5–15.5)
WBC: 4.6 10*3/uL (ref 4.0–10.5)
nRBC: 0 % (ref 0.0–0.2)

## 2020-02-10 LAB — BASIC METABOLIC PANEL
Anion gap: 10 (ref 5–15)
BUN: 9 mg/dL (ref 6–20)
CO2: 25 mmol/L (ref 22–32)
Calcium: 8.7 mg/dL — ABNORMAL LOW (ref 8.9–10.3)
Chloride: 102 mmol/L (ref 98–111)
Creatinine, Ser: 0.92 mg/dL (ref 0.61–1.24)
GFR calc Af Amer: >60 mL/min (ref >60)
GFR calc non Af Amer: >60 mL/min (ref >60)
Glucose, Bld: 102 mg/dL — ABNORMAL HIGH (ref 70–99)
Potassium: 3.6 mmol/L (ref 3.5–5.1)
Sodium: 137 mmol/L (ref 135–145)

## 2020-02-10 LAB — CK: Total CK: 20251 U/L — ABNORMAL HIGH (ref 49–397)

## 2020-02-10 LAB — RAPID URINE DRUG SCREEN, HOSP PERFORMED
Amphetamines: NOT DETECTED
Barbiturates: NOT DETECTED
Benzodiazepines: NOT DETECTED
Cocaine: NOT DETECTED
Opiates: NOT DETECTED
Tetrahydrocannabinol: POSITIVE — AB

## 2020-02-10 LAB — HIV ANTIBODY (ROUTINE TESTING W REFLEX): HIV Screen 4th Generation wRfx: NONREACTIVE

## 2020-02-10 LAB — SARS CORONAVIRUS 2 BY RT PCR (HOSPITAL ORDER, PERFORMED IN ~~LOC~~ HOSPITAL LAB): SARS Coronavirus 2: NEGATIVE

## 2020-02-10 MED ORDER — NICOTINE 7 MG/24HR TD PT24
7.0000 mg | MEDICATED_PATCH | Freq: Every day | TRANSDERMAL | Status: DC
  Administered 2020-02-10 – 2020-02-12 (×3): 7 mg via TRANSDERMAL
  Filled 2020-02-10 (×4): qty 1

## 2020-02-10 MED ORDER — SODIUM CHLORIDE 0.9 % IV SOLN: INTRAVENOUS | Status: DC

## 2020-02-10 MED ORDER — OXYCODONE HCL 5 MG PO TABS
5.0000 mg | ORAL_TABLET | ORAL | Status: DC | PRN
  Administered 2020-02-10 – 2020-02-11 (×2): 5 mg via ORAL
  Filled 2020-02-10 (×2): qty 1

## 2020-02-10 MED ORDER — ONDANSETRON HCL 4 MG/2ML IJ SOLN: 4.0000 mg | Freq: Four times a day (QID) | INTRAMUSCULAR | Status: DC | PRN

## 2020-02-10 MED ORDER — KETOROLAC TROMETHAMINE 30 MG/ML IJ SOLN: 30.0000 mg | Freq: Four times a day (QID) | INTRAMUSCULAR | Status: DC | PRN

## 2020-02-10 MED ORDER — ONDANSETRON HCL 4 MG PO TABS: 4.0000 mg | ORAL_TABLET | Freq: Four times a day (QID) | ORAL | Status: DC | PRN

## 2020-02-10 MED ORDER — ACETAMINOPHEN 325 MG PO TABS: 650.0000 mg | ORAL_TABLET | Freq: Four times a day (QID) | ORAL | Status: DC | PRN

## 2020-02-10 MED ORDER — ENOXAPARIN SODIUM 40 MG/0.4ML ~~LOC~~ SOLN
40.0000 mg | SUBCUTANEOUS | Status: DC
  Administered 2020-02-10 – 2020-02-12 (×3): 40 mg via SUBCUTANEOUS
  Filled 2020-02-10 (×3): qty 0.4

## 2020-02-10 MED ORDER — ACETAMINOPHEN 650 MG RE SUPP: 650.0000 mg | Freq: Four times a day (QID) | RECTAL | Status: DC | PRN

## 2020-02-10 MED ORDER — SODIUM CHLORIDE 0.9 % IV BOLUS (SEPSIS)
1000.0000 mL | Freq: Once | INTRAVENOUS | Status: AC
  Administered 2020-02-10: 1000 mL via INTRAVENOUS

## 2020-02-10 MED ORDER — IBUPROFEN 400 MG PO TABS
400.0000 mg | ORAL_TABLET | Freq: Once | ORAL | Status: AC
  Administered 2020-02-10: 400 mg via ORAL
  Filled 2020-02-10: qty 1

## 2020-02-10 MED ORDER — SODIUM CHLORIDE 0.9 % IV BOLUS
1000.0000 mL | Freq: Once | INTRAVENOUS | Status: AC
  Administered 2020-02-10: 1000 mL via INTRAVENOUS

## 2020-02-10 NOTE — ED Triage Notes (Signed)
Pt C/O chest pain that started 3 days ago. Pt states it started the morning after doing pushups. Pt stating it started on the right side and radiated across to the middle and left.

## 2020-02-10 NOTE — ED Notes (Signed)
Pt sleeping with equal rise and chest fall  

## 2020-02-10 NOTE — ED Provider Notes (Signed)
[facility] EMERGENCY DEPARTMENT Provider Note   CSN: [id number] Arrival date & time: 02/10/20  0541     History Chief Complaint  Patient presents with  . Chest Pain    Nathaniel Holt is a 29 y.o. male.  The history is provided by the patient.  Chest Pain Pain location:  L chest and R chest Pain quality: aching   Pain severity:  Severe Onset quality:  Gradual Duration:  2 days Timing:  Constant Progression:  Worsening Chronicity:  New Relieved by:  Rest Worsened by:  Deep breathing, certain positions and movement Associated symptoms: no abdominal pain, no cough, no fever, no shortness of breath, no syncope and no vomiting   Associated symptoms comment:  Denies hemoptysis Risk factors: smoking   Risk factors: no coronary artery disease and no prior DVT/PE   Patient presents for chest wall pain.  Patient reports approximately 3 days ago he all of a sudden did up to 70 push-ups.  He reports he has not worked out in quite some time.  He did not stop until his arms started hurting.  The following day he woke up and had severe pain in his chest wall.  It is worse with movement and deep breathing.  He reports his pain is so severe that he cannot lift any boxes.  No fevers or vomiting.  No shortness of breath. No history of CAD/VTE. He is not taking any medications except topical creams     Past Medical History:  Diagnosis Date  . Bipolar disorder (HCC)   . Schizophrenia (HCC)     There are no problems to display for this patient.   Past Surgical History:  Procedure Laterality Date  . LEG SURGERY         No family history on file.  Social History   Tobacco Use  . Smoking status: Current Every Day Smoker    Packs/day: 1.00    Types: Cigarettes  . Smokeless tobacco: Never Used  Substance Use Topics  . Alcohol use: No  . Drug use: No    Home Medications Prior to Admission medications   Not on File    Allergies    Patient has no known  allergies.  Review of Systems   Review of Systems  Constitutional: Negative for fever.  Respiratory: Negative for cough and shortness of breath.   Cardiovascular: Positive for chest pain. Negative for syncope.  Gastrointestinal: Negative for abdominal pain and vomiting.  All other systems reviewed and are negative.   Physical Exam Updated Vital Signs BP (!) 118/91   Pulse 60   Temp 98.3 F (36.8 C) (Oral)   Resp 14   Ht 1.753 m (5\' 9" )   Wt 72.6 kg   SpO2 98%   BMI 23.63 kg/m   Physical Exam CONSTITUTIONAL: Well developed/well nourished HEAD: Normocephalic/atraumatic EYES: EOMI ENMT: Mucous membranes moist NECK: supple no meningeal signs SPINE/BACK:entire spine nontender CV: S1/S2 noted, no murmurs/rubs/gallops noted LUNGS: Lungs are clear to auscultation bilaterally, no apparent distress Chest - diffuse chest wall tenderness, no bruising/erythema/crepitus.  No signs of trauma.  No swelling noted ABDOMEN: soft NEURO: Pt is awake/alert/appropriate, moves all extremitiesx4.  No facial droop.  EXTREMITIES: pulses normal/equal, full ROM, no calf tenderness or edema SKIN: warm, color normal PSYCH: no abnormalities of mood noted, alert and oriented to situation  ED Results / Procedures / Treatments   Labs (all labs ordered are listed, but only abnormal results are displayed) Labs Reviewed  BASIC METABOLIC PANEL  CK    EKG EKG Interpretation  Date/Time:  Thursday February 10 2020 05:53:42 EDT Ventricular Rate:  63 PR Interval:    QRS Duration: 95 QT Interval:  374 QTC Calculation: 383 R Axis:   78 Text Interpretation: Sinus rhythm Normal ECG No previous ECGs available Confirmed by Ripley Fraise [phone]) on 02/10/2020 6:00:25 AM   Radiology DG Chest 2 View  Result Date: 02/10/2020 CLINICAL DATA:  Chest pain EXAM: CHEST - 2 VIEW COMPARISON:  07/17/2016 FINDINGS: The heart size and mediastinal contours are within normal limits. Both lungs are clear. The visualized  skeletal structures are unremarkable. IMPRESSION: No active cardiopulmonary disease. Electronically Signed   By: Monte Fantasia M.D.   On: 02/10/2020 06:08    Procedures Procedures   Medications Ordered in ED Medications  sodium chloride 0.9 % bolus 1,000 mL (1,000 mLs Intravenous New Bag/Given 02/10/20 0635)  ibuprofen (ADVIL) tablet 400 mg (400 mg Oral Given 02/10/20 0554)    ED Course  I have reviewed the triage vital signs and the nursing notes.  Pertinent labs/imaging results that were available during my care of the patient were reviewed by me and considered in my medical decision making (see chart for details).    MDM Rules/Calculators/A&P                      Concern for possible rhabo after excessive exercise Signed out to dr Sedonia Small with labs pending  Final Clinical Impression(s) / ED Diagnoses Final diagnoses:  Precordial pain  Chest wall muscle strain, initial encounter    Rx / DC Orders ED Discharge Orders    None       Ripley Fraise, MD 02/10/20 [phone]

## 2020-02-10 NOTE — ED Provider Notes (Signed)
  Provider Note MRN:  [id number]  Arrival date & time: 02/10/20    ED Course and Medical Decision Making  Assumed care from Dr. Bebe Shaggy at shift change.  CK is markedly elevated at greater than 20,000, will admit to hospitalist service.  Procedures  Final Clinical Impressions(s) / ED Diagnoses     ICD-10-CM   1. Precordial pain  R07.2   2. Chest wall muscle strain, initial encounter  S29.011A   3. Non-traumatic rhabdomyolysis  M62.82     ED Discharge Orders    None      Discharge Instructions   None     Elmer Sow. Pilar Plate, MD [facility] [email or web address]    Sabas Sous, MD 02/10/20 [phone]

## 2020-02-10 NOTE — ED Notes (Signed)
Pt resting with equal rise and chest fall  

## 2020-02-10 NOTE — H&P (Signed)
History and Physical    Nathaniel Holt:[id number] DOB: October 24, 1990 DOA: 02/10/2020  PCP: Pt, No Pcp Per (Inactive)   Patient coming from: Home  Chief Complaint: Chest soreness  HPI: Nathaniel Holt is a 29 y.o. male with medical history significant for ongoing tobacco abuse who presented to the ED with chest wall pain that began to gradually develop about 3 days ago when he suddenly decided to do 70 push-ups.  He states that he is usually sedentary and has not worked out in quite some time and decided to do a lot of physical activity all at once.  He stopped doing push-ups once his arm started hurting the following day he continued to have severe pain that was worse with movement and deep breathing.  He cannot lift any objects at this time due to his pain.  He denies any fevers or chills.  No nausea, vomiting, shortness of breath, cough, hemoptysis, or other symptoms are reported.   ED Course: Stable vital signs are noted and labs are otherwise normal except for CK level of 20,251.  2 view chest x-ray with negative findings and EKG within normal limits.  He has been given 2 L fluid bolus.  Review of Systems: All others reviewed as noted above and negative otherwise.  Past Medical History:  Diagnosis Date  . Bipolar disorder (HCC)   . Schizophrenia [facility])     Past Surgical History:  Procedure Laterality Date  . LEG SURGERY       reports that he has been smoking cigarettes. He has been smoking about 1.00 pack per day. He has never used smokeless tobacco. He reports that he does not drink alcohol or use drugs.  No Known Allergies  No family history on file.  Prior to Admission medications   Not on File    Physical Exam: Vitals:   02/10/20 0630 02/10/20 0700 02/10/20 0800 02/10/20 0900  BP: (!) 118/91 (!) 129/92 116/87 122/89  Pulse: 60 (!) 59 63 62  Resp: 14 19 18 12   Temp:      TempSrc:      SpO2: 98% 98% 100% 99%  Weight:      Height:        Constitutional: NAD,  calm, comfortable Vitals:   02/10/20 0630 02/10/20 0700 02/10/20 0800 02/10/20 0900  BP: (!) 118/91 (!) 129/92 116/87 122/89  Pulse: 60 (!) 59 63 62  Resp: 14 19 18 12   Temp:      TempSrc:      SpO2: 98% 98% 100% 99%  Weight:      Height:       Eyes: lids and conjunctivae normal ENMT: Mucous membranes are moist.  Neck: normal, supple Respiratory: clear to auscultation bilaterally. Normal respiratory effort. No accessory muscle use.  Cardiovascular: Regular rate and rhythm, no murmurs. No extremity edema. Abdomen: no tenderness, no distention. Bowel sounds positive.  Musculoskeletal:  No joint deformity upper and lower extremities.  Tenderness over chest wall bilaterally. Skin: no rashes, lesions, ulcers.  Psychiatric: Normal judgment and insight. Alert and oriented x 3. Normal mood.   Labs on Admission: I have personally reviewed following labs and imaging studies  CBC: No results for input(s): WBC, NEUTROABS, HGB, HCT, MCV, PLT in the last 168 hours. Basic Metabolic Panel: Recent Labs  Lab 02/10/20 0632  NA 137  K 3.6  CL 102  CO2 25  GLUCOSE 102*  BUN 9  CREATININE 0.92  CALCIUM 8.7*   GFR: Estimated Creatinine Clearance:  117.4 mL/min (by C-G formula based on SCr of 0.92 mg/dL). Liver Function Tests: No results for input(s): AST, ALT, ALKPHOS, BILITOT, PROT, ALBUMIN in the last 168 hours. No results for input(s): LIPASE, AMYLASE in the last 168 hours. No results for input(s): AMMONIA in the last 168 hours. Coagulation Profile: No results for input(s): INR, PROTIME in the last 168 hours. Cardiac Enzymes: Recent Labs  Lab 02/10/20 0632  CKTOTAL 20,251*   BNP (last 3 results) No results for input(s): PROBNP in the last 8760 hours. HbA1C: No results for input(s): HGBA1C in the last 72 hours. CBG: No results for input(s): GLUCAP in the last 168 hours. Lipid Profile: No results for input(s): CHOL, HDL, LDLCALC, TRIG, CHOLHDL, LDLDIRECT in the last 72  hours. Thyroid Function Tests: No results for input(s): TSH, T4TOTAL, FREET4, T3FREE, THYROIDAB in the last 72 hours. Anemia Panel: No results for input(s): VITAMINB12, FOLATE, FERRITIN, TIBC, IRON, RETICCTPCT in the last 72 hours. Urine analysis:    Component Value Date/Time   COLORURINE AMBER (A) 01/21/2020 1002   APPEARANCEUR CLEAR 01/21/2020 1002   LABSPEC 1.015 01/21/2020 1002   PHURINE 6.0 01/21/2020 [place] 01/21/2020 [place] 01/21/2020 [place] 01/21/2020 [place] 01/21/2020 [place] 01/21/2020 1002   UROBILINOGEN 1.0 06/17/2009 2137   NITRITE POSITIVE (A) 01/21/2020 1002   LEUKOCYTESUR NEGATIVE 01/21/2020 1002    Radiological Exams on Admission: DG Chest 2 View  Result Date: 02/10/2020 CLINICAL DATA:  Chest pain EXAM: CHEST - 2 VIEW COMPARISON:  07/17/2016 FINDINGS: The heart size and mediastinal contours are within normal limits. Both lungs are clear. The visualized skeletal structures are unremarkable. IMPRESSION: No active cardiopulmonary disease. Electronically Signed   By: Monte Fantasia M.D.   On: 02/10/2020 06:08    EKG: Independently reviewed.  Sinus rhythm 63 bpm.  Assessment/Plan Active Problems:   Rhabdomyolysis    Nontraumatic rhabdomyolysis -Occurred due to strenuous physical activity with ongoing chest soreness -No reported illicit drug use aside from marijuana -Plan to check UDS -Maintain on aggressive IV fluid and check CK levels with plans for discharge if downtrending and below 10,000 -No signs of transaminitis or AKI noted  Ongoing tobacco abuse -Counseled on cessation -Nicotine patch offered   DVT prophylaxis: Lovenox Code Status: Full Family Communication: None at bedside Disposition Plan: Admit for treatment of rhabdomyolysis. Consults called: None Admission status: Observation, MedSurg   Yves Fodor D Ryane Konieczny DO [facility]  If 7PM-7AM, please  contact night-coverage [email or web address] Status is: Observation  The patient remains OBS appropriate and will d/c before 2 midnights.  Dispo: The patient is from: Home              Anticipated d/c is to: Home              Anticipated d/c date is: 1 day              Patient currently is not medically stable to d/c.  He requires aggressive IV fluid due to his rhabdomyolysis.   02/10/2020, 12:02 PM

## 2020-02-11 LAB — COMPREHENSIVE METABOLIC PANEL
ALT: 50 U/L — ABNORMAL HIGH (ref 0–44)
AST: 110 U/L — ABNORMAL HIGH (ref 15–41)
Albumin: 3.3 g/dL — ABNORMAL LOW (ref 3.5–5.0)
Alkaline Phosphatase: 54 U/L (ref 38–126)
Anion gap: 4 — ABNORMAL LOW (ref 5–15)
BUN: 5 mg/dL — ABNORMAL LOW (ref 6–20)
CO2: 26 mmol/L (ref 22–32)
Calcium: 8 mg/dL — ABNORMAL LOW (ref 8.9–10.3)
Chloride: 110 mmol/L (ref 98–111)
Creatinine, Ser: 0.86 mg/dL (ref 0.61–1.24)
GFR calc Af Amer: >60 mL/min (ref >60)
GFR calc non Af Amer: >60 mL/min (ref >60)
Glucose, Bld: 87 mg/dL (ref 70–99)
Potassium: 3.9 mmol/L (ref 3.5–5.1)
Sodium: 140 mmol/L (ref 135–145)
Total Bilirubin: 0.9 mg/dL (ref 0.3–1.2)
Total Protein: 6 g/dL — ABNORMAL LOW (ref 6.5–8.1)

## 2020-02-11 LAB — CK: Total CK: 19316 U/L — ABNORMAL HIGH (ref 49–397)

## 2020-02-11 NOTE — Progress Notes (Signed)
PROGRESS NOTE    Nathaniel Holt  DEY:[id number] DOB: 30-Nov-1990 DOA: 02/10/2020 PCP: Pt, No Pcp Per (Inactive)   Brief Narrative:  Per HPI: Nathaniel Holt is a 29 y.o. male with medical history significant for ongoing tobacco abuse who presented to the ED with chest wall pain that began to gradually develop about 3 days ago when he suddenly decided to do 70 push-ups.  He states that he is usually sedentary and has not worked out in quite some time and decided to do a lot of physical activity all at once.  He stopped doing push-ups once his arm started hurting the following day he continued to have severe pain that was worse with movement and deep breathing.  He cannot lift any objects at this time due to his pain.  He denies any fevers or chills.  No nausea, vomiting, shortness of breath, cough, hemoptysis, or other symptoms are reported.  -Patient was admitted with nontraumatic rhabdomyolysis that occurred in the setting of strenuous physical activity.  He reports ongoing chest wall soreness and continues to have significantly elevated CK levels with minimal decrease noted overnight.  Plan to continue aggressive IV fluid.  Assessment & Plan:   Active Problems:   Rhabdomyolysis   Nontraumatic rhabdomyolysis -Occurred due to strenuous physical activity with ongoing chest soreness -No reported illicit drug use aside from marijuana -UDS with noted cannabis, but no cocaine -Maintain on aggressive IV fluid and check CK levels with plans for discharge if downtrending and below 5000 -No signs of transaminitis or AKI noted  Ongoing tobacco abuse -Counseled on cessation -Nicotine patch offered   DVT prophylaxis: Lovenox Code Status: Full code Family Communication: Patient will discuss with spouse Disposition Plan: Continue aggressive IV fluid. Status is: Inpatient  Remains inpatient appropriate because:IV treatments appropriate due to intensity of illness or inability to take  PO   Dispo: The patient is from: Home              Anticipated d/c is to: Home              Anticipated d/c date is: 2 days              Patient currently is not medically stable to d/c.  Patient requires aggressive IV fluid resuscitation for his ongoing rhabdomyolysis.   Consultants:   None  Procedures:   See below  Antimicrobials:   None   Subjective: Patient seen and evaluated today with no new acute complaints or concerns. No acute concerns or events noted overnight.  He continues to have chest wall soreness.  Objective: Vitals:   02/10/20 1700 02/10/20 2018 02/11/20 0009 02/11/20 0624  BP: 123/85 115/81 112/79 130/84  Pulse: (!) 49 (!) 49 (!) 47 (!) 49  Resp: 14   15  Temp:  (!) 97.2 F (36.2 C) 98.3 F (36.8 C) 97.7 F (36.5 C)  TempSrc:  Oral Oral Oral  SpO2: 100% 99% 99% 100%  Weight:      Height:        Intake/Output Summary (Last 24 hours) at 02/11/2020 1224 Last data filed at 02/11/2020 0900 Gross per 24 hour  Intake 240 ml  Output 400 ml  Net -160 ml   Filed Weights   02/10/20 0550  Weight: 72.6 kg    Examination:  General exam: Appears calm and comfortable  Respiratory system: Clear to auscultation. Respiratory effort normal. Cardiovascular system: S1 & S2 heard, RRR. No JVD, murmurs, rubs, gallops or clicks. No pedal  edema. Gastrointestinal system: Abdomen is nondistended, soft and nontender. No organomegaly or masses felt. Normal bowel sounds heard. Central nervous system: Alert and oriented. No focal neurological deficits. Extremities: Symmetric 5 x 5 power. Skin: No rashes, lesions or ulcers Psychiatry: Judgement and insight appear normal. Mood & affect appropriate.     Data Reviewed: I have personally reviewed following labs and imaging studies  CBC: Recent Labs  Lab 02/10/20 1236  WBC 4.6  HGB 13.3  HCT 39.6  MCV 95.4  PLT 229   Basic Metabolic Panel: Recent Labs  Lab 02/10/20 0632 02/11/20 0503  NA 137 140  K 3.6 3.9   CL 102 110  CO2 25 26  GLUCOSE 102* 87  BUN 9 5*  CREATININE 0.92 0.86  CALCIUM 8.7* 8.0*   GFR: Estimated Creatinine Clearance: 125.6 mL/min (by C-G formula based on SCr of 0.86 mg/dL). Liver Function Tests: Recent Labs  Lab 02/11/20 0503  AST 110*  ALT 50*  ALKPHOS 54  BILITOT 0.9  PROT 6.0*  ALBUMIN 3.3*   No results for input(s): LIPASE, AMYLASE in the last 168 hours. No results for input(s): AMMONIA in the last 168 hours. Coagulation Profile: No results for input(s): INR, PROTIME in the last 168 hours. Cardiac Enzymes: Recent Labs  Lab 02/10/20 0632 02/11/20 0503  CKTOTAL 20,251* 19,316*   BNP (last 3 results) No results for input(s): PROBNP in the last 8760 hours. HbA1C: No results for input(s): HGBA1C in the last 72 hours. CBG: No results for input(s): GLUCAP in the last 168 hours. Lipid Profile: No results for input(s): CHOL, HDL, LDLCALC, TRIG, CHOLHDL, LDLDIRECT in the last 72 hours. Thyroid Function Tests: No results for input(s): TSH, T4TOTAL, FREET4, T3FREE, THYROIDAB in the last 72 hours. Anemia Panel: No results for input(s): VITAMINB12, FOLATE, FERRITIN, TIBC, IRON, RETICCTPCT in the last 72 hours. Sepsis Labs: No results for input(s): PROCALCITON, LATICACIDVEN in the last 168 hours.  Recent Results (from the past 240 hour(s))  SARS Coronavirus 2 by RT PCR (hospital order, performed in [facility] hospital lab) Nasopharyngeal Nasopharyngeal Swab     Status: None   Collection Time: 02/10/20  9:34 AM   Specimen: Nasopharyngeal Swab  Result Value Ref Range Status   SARS Coronavirus 2 NEGATIVE NEGATIVE Final    Comment: (NOTE) SARS-CoV-2 target nucleic acids are NOT DETECTED. The SARS-CoV-2 RNA is generally detectable in upper and lower respiratory specimens during the acute phase of infection. The lowest concentration of SARS-CoV-2 viral copies this assay can detect is 250 copies / mL. A negative result does not preclude SARS-CoV-2  infection and should not be used as the sole basis for treatment or other patient management decisions.  A negative result may occur with improper specimen collection / handling, submission of specimen other than nasopharyngeal swab, presence of viral mutation(s) within the areas targeted by this assay, and inadequate number of viral copies (<250 copies / mL). A negative result must be combined with clinical observations, patient history, and epidemiological information. Fact Sheet for Patients:   BoilerBrush.com.cy Fact Sheet for Healthcare Providers: https://pope.com/ This test is not yet approved or cleared  by the [place] FDA and has been authorized for detection and/or diagnosis of SARS-CoV-2 by FDA under an Emergency Use Authorization (EUA).  This EUA will remain in effect (meaning this test can be used) for the duration of the COVID-19 declaration under Section 564(b)(1) of the Act, 21 U.S.C. section 360bbb-3(b)(1), unless the authorization is terminated or revoked sooner. Performed at [facility], [address] [place], [place] [address]          Radiology Studies: DG Chest 2 View  Result Date: 02/10/2020 CLINICAL DATA:  Chest pain EXAM: CHEST - 2 VIEW COMPARISON:  07/17/2016 FINDINGS: The heart size and mediastinal contours are within normal limits. Both lungs are clear. The visualized skeletal structures are unremarkable. IMPRESSION: No active cardiopulmonary disease. Electronically Signed   By: Marnee Spring M.D.   On: 02/10/2020 06:08        Scheduled Meds: . enoxaparin (LOVENOX) injection  40 mg Subcutaneous Q24H  . nicotine  7 mg Transdermal Daily   Continuous Infusions: . sodium chloride 200 mL/hr at 02/10/20 0809     LOS: 0 days    Time spent: 30 minutes    [place] Hoover Brunette, DO [facility]  If 7PM-7AM, please contact night-coverage [email or web address] 02/11/2020, 12:24 PM

## 2020-02-12 LAB — COMPREHENSIVE METABOLIC PANEL
ALT: 56 U/L — ABNORMAL HIGH (ref 0–44)
AST: 123 U/L — ABNORMAL HIGH (ref 15–41)
Albumin: 3.4 g/dL — ABNORMAL LOW (ref 3.5–5.0)
Alkaline Phosphatase: 59 U/L (ref 38–126)
Anion gap: 6 (ref 5–15)
BUN: <5 mg/dL — ABNORMAL LOW (ref 6–20)
CO2: 26 mmol/L (ref 22–32)
Calcium: 8.2 mg/dL — ABNORMAL LOW (ref 8.9–10.3)
Chloride: 107 mmol/L (ref 98–111)
Creatinine, Ser: 0.8 mg/dL (ref 0.61–1.24)
GFR calc Af Amer: >60 mL/min (ref >60)
GFR calc non Af Amer: >60 mL/min (ref >60)
Glucose, Bld: 93 mg/dL (ref 70–99)
Potassium: 3.8 mmol/L (ref 3.5–5.1)
Sodium: 139 mmol/L (ref 135–145)
Total Bilirubin: 0.9 mg/dL (ref 0.3–1.2)
Total Protein: 6.2 g/dL — ABNORMAL LOW (ref 6.5–8.1)

## 2020-02-12 LAB — CK: Total CK: 20128 U/L — ABNORMAL HIGH (ref 49–397)

## 2020-02-12 MED ORDER — STERILE WATER FOR INJECTION IV SOLN
INTRAVENOUS | Status: DC
  Filled 2020-02-12 (×8): qty 850

## 2020-02-12 NOTE — Progress Notes (Signed)
PROGRESS NOTE    Nathaniel Holt  SPQ:[id number] DOB: 06-Jan-1991 DOA: 02/10/2020 PCP: Pt, No Pcp Per (Inactive)   Brief Narrative:  Per HPI: Nathaniel Flock Wilkersonis a 29 y.o.malewith medical history significant forongoing tobacco abuse who presented to the ED with chest wall pain that began to gradually develop about 3 days ago when he suddenly decided to do 70 push-ups. He states that he is usually sedentary and has not worked out in quite some time and decided to do a lot of physical activity all at once. He stopped doing push-ups once his arm started hurting the following day he continued to have severe pain that was worse with movement and deep breathing. He cannot lift any objects at this time due to his pain. He denies any fevers or chills. No nausea, vomiting, shortness of breath, cough, hemoptysis, or other symptoms are reported.  -Patient was admitted with nontraumatic rhabdomyolysis that occurred in the setting of strenuous physical activity.  He reports ongoing chest wall soreness and continues to have significantly elevated CK levels that have not been downtrending despite aggressive IV fluid administration.  Plan to switch to bicarbonate infusion today and follow-up labs in a.m.   Assessment & Plan:   Active Problems:   Rhabdomyolysis  Nontraumatic rhabdomyolysis -Occurred due to strenuous physical activity with ongoing chest soreness -No reported illicit drug use aside from marijuana -UDS with noted cannabis, but no cocaine -Maintain on aggressive IV fluid, now with bicarb drip and check CK levels with plans for discharge if downtrending and below 5000 -Mild transaminitis noted, but no AKI  Ongoing tobacco abuse -Counseled on cessation -Nicotine patch offered   DVT prophylaxis: Lovenox Code Status: Full code Family Communication:  Discussed with mother at bedside 6/5 Disposition Plan: Continue aggressive IV fluid, now with bicarbonate. Status is:  Inpatient  Remains inpatient appropriate because:IV treatments appropriate due to intensity of illness or inability to take PO   Dispo: The patient is from: Home  Anticipated d/c is to: Home  Anticipated d/c date is: 1-2 days  Patient currently is not medically stable to d/c.  Patient requires aggressive IV fluid resuscitation for his ongoing rhabdomyolysis.  His levels have not been decreasing and he will require sodium bicarbonate drip at this point.   Consultants:   None  Procedures:   See below  Antimicrobials:   None   Subjective: Patient seen and evaluated today with no new acute complaints or concerns. No acute concerns or events noted overnight.  He denies any further chest wall pain and is eager to go home, but understands that he needs to stay.  Objective: Vitals:   02/11/20 0624 02/11/20 1530 02/11/20 2136 02/12/20 0601  BP: 130/84 120/81 129/80 119/80  Pulse: (!) 49 (!) 57 (!) 49 (!) 49  Resp: 15 20 18 16   Temp: 97.7 F (36.5 C) 98.5 F (36.9 C) 99 F (37.2 C) 97.8 F (36.6 C)  TempSrc: Oral  Oral Oral  SpO2: 100% 100% 100% 100%  Weight:      Height:        Intake/Output Summary (Last 24 hours) at 02/12/2020 1059 Last data filed at 02/12/2020 0300 Gross per 24 hour  Intake 8480.01 ml  Output --  Net 8480.01 ml   Filed Weights   02/10/20 0550  Weight: 72.6 kg    Examination:  General exam: Appears calm and comfortable  Respiratory system: Clear to auscultation. Respiratory effort normal. Cardiovascular system: S1 & S2 heard, RRR. No JVD, murmurs, rubs,  gallops or clicks. No pedal edema. Gastrointestinal system: Abdomen is nondistended, soft and nontender. No organomegaly or masses felt. Normal bowel sounds heard. Central nervous system: Alert and oriented. No focal neurological deficits. Extremities: Symmetric 5 x 5 power. Skin: No rashes, lesions or ulcers Psychiatry: Judgement and insight appear  normal. Mood & affect appropriate.     Data Reviewed: I have personally reviewed following labs and imaging studies  CBC: Recent Labs  Lab 02/10/20 1236  WBC 4.6  HGB 13.3  HCT 39.6  MCV 95.4  PLT 099   Basic Metabolic Panel: Recent Labs  Lab 02/10/20 0632 02/11/20 0503 02/12/20 0651  NA 137 140 139  K 3.6 3.9 3.8  CL 102 110 107  CO2 25 26 26   GLUCOSE 102* 87 93  BUN 9 5* <5*  CREATININE 0.92 0.86 0.80  CALCIUM 8.7* 8.0* 8.2*   GFR: Estimated Creatinine Clearance: 135 mL/min (by C-G formula based on SCr of 0.8 mg/dL). Liver Function Tests: Recent Labs  Lab 02/11/20 0503 02/12/20 0651  AST 110* 123*  ALT 50* 56*  ALKPHOS 54 59  BILITOT 0.9 0.9  PROT 6.0* 6.2*  ALBUMIN 3.3* 3.4*   No results for input(s): LIPASE, AMYLASE in the last 168 hours. No results for input(s): AMMONIA in the last 168 hours. Coagulation Profile: No results for input(s): INR, PROTIME in the last 168 hours. Cardiac Enzymes: Recent Labs  Lab 02/10/20 0632 02/11/20 0503 02/12/20 0651  CKTOTAL 20,251* 19,316* 20,128*   BNP (last 3 results) No results for input(s): PROBNP in the last 8760 hours. HbA1C: No results for input(s): HGBA1C in the last 72 hours. CBG: No results for input(s): GLUCAP in the last 168 hours. Lipid Profile: No results for input(s): CHOL, HDL, LDLCALC, TRIG, CHOLHDL, LDLDIRECT in the last 72 hours. Thyroid Function Tests: No results for input(s): TSH, T4TOTAL, FREET4, T3FREE, THYROIDAB in the last 72 hours. Anemia Panel: No results for input(s): VITAMINB12, FOLATE, FERRITIN, TIBC, IRON, RETICCTPCT in the last 72 hours. Sepsis Labs: No results for input(s): PROCALCITON, LATICACIDVEN in the last 168 hours.  Recent Results (from the past 240 hour(s))  SARS Coronavirus 2 by RT PCR (hospital order, performed in [facility] hospital lab) Nasopharyngeal Nasopharyngeal Swab     Status: None   Collection Time: 02/10/20  9:34 AM   Specimen: Nasopharyngeal Swab   Result Value Ref Range Status   SARS Coronavirus 2 NEGATIVE NEGATIVE Final    Comment: (NOTE) SARS-CoV-2 target nucleic acids are NOT DETECTED. The SARS-CoV-2 RNA is generally detectable in upper and lower respiratory specimens during the acute phase of infection. The lowest concentration of SARS-CoV-2 viral copies this assay can detect is 250 copies / mL. A negative result does not preclude SARS-CoV-2 infection and should not be used as the sole basis for treatment or other patient management decisions.  A negative result may occur with improper specimen collection / handling, submission of specimen other than nasopharyngeal swab, presence of viral mutation(s) within the areas targeted by this assay, and inadequate number of viral copies (<250 copies / mL). A negative result must be combined with clinical observations, patient history, and epidemiological information. Fact Sheet for Patients:   StrictlyIdeas.no Fact Sheet for Healthcare Providers: BankingDealers.co.za This test is not yet approved or cleared  by the [place] FDA and has been authorized for detection and/or diagnosis of SARS-CoV-2 by FDA under an Emergency Use Authorization (EUA).  This EUA will remain in effect (meaning this test can be used) for the  duration of the COVID-19 declaration under Section 564(b)(1) of the Act, 21 U.S.C. section 360bbb-3(b)(1), unless the authorization is terminated or revoked sooner. Performed at [facility] [address], [place], [place] [address]          Radiology Studies: No results found.      Scheduled Meds: . enoxaparin (LOVENOX) injection  40 mg Subcutaneous Q24H  . nicotine  7 mg Transdermal Daily   Continuous Infusions: .  sodium bicarbonate (isotonic) infusion in sterile water       LOS: 1 day    Time spent: 35 minutes    Tearra Ouk Hoover Brunette, DO [facility]  If 7PM-7AM, please contact  night-coverage [email or web address] 02/12/2020, 10:59 AM

## 2020-02-13 LAB — COMPREHENSIVE METABOLIC PANEL
ALT: 66 U/L — ABNORMAL HIGH (ref 0–44)
AST: 124 U/L — ABNORMAL HIGH (ref 15–41)
Albumin: 3.8 g/dL (ref 3.5–5.0)
Alkaline Phosphatase: 66 U/L (ref 38–126)
Anion gap: 8 (ref 5–15)
BUN: <5 mg/dL — ABNORMAL LOW (ref 6–20)
CO2: 26 mmol/L (ref 22–32)
Calcium: 8.7 mg/dL — ABNORMAL LOW (ref 8.9–10.3)
Chloride: 104 mmol/L (ref 98–111)
Creatinine, Ser: 0.8 mg/dL (ref 0.61–1.24)
GFR calc Af Amer: >60 mL/min (ref >60)
GFR calc non Af Amer: >60 mL/min (ref >60)
Glucose, Bld: 105 mg/dL — ABNORMAL HIGH (ref 70–99)
Potassium: 3.4 mmol/L — ABNORMAL LOW (ref 3.5–5.1)
Sodium: 138 mmol/L (ref 135–145)
Total Bilirubin: 0.9 mg/dL (ref 0.3–1.2)
Total Protein: 7 g/dL (ref 6.5–8.1)

## 2020-02-13 LAB — CK: Total CK: 18531 U/L — ABNORMAL HIGH (ref 49–397)

## 2020-02-13 NOTE — Discharge Summary (Signed)
Physician Discharge Summary  [name]:[id number] DOB: 1991-03-20 DOA: 02/10/2020  PCP: Pt, No Pcp Per (Inactive)  Admit date: 02/10/2020  Discharge date: 02/13/2020 LEFT AMA  Admitted From:Home  Disposition:  AMA DISCHARGE  Brief/Interim Summary: Per HPI: Nathaniel Flock Wilkersonis a 29 y.o.malewith medical history significant forongoing tobacco abuse who presented to the ED with chest wall pain that began to gradually develop about 3 days ago when he suddenly decided to do 70 push-ups. He states that he is usually sedentary and has not worked out in quite some time and decided to do a lot of physical activity all at once. He stopped doing push-ups once his arm started hurting the following day he continued to have severe pain that was worse with movement and deep breathing. He cannot lift any objects at this time due to his pain. He denies any fevers or chills. No nausea, vomiting, shortness of breath, cough, hemoptysis, or other symptoms are reported.  -Patient was admitted with nontraumatic rhabdomyolysis that occurred in the setting of strenuous physical activity. He reports ongoing chest wall soreness and continues to have significantly elevated CK levels  that were remaining stable despite aggressive IV fluid hydration with normal saline.  This was switched over to sodium bicarbonate infusion on 6/5 and even despite this treatment, he continued to have elevated levels of at least 18,000.  I had discussed with him that he should remain in the hospital on aggressive IV fluid hydration until his levels reach closer to 10,000 and then he can follow-up outpatient, but he refuses to stay at this time.  He states that he needs to go home and understands the risks of worsening kidney injury that may take place.  He is alert and oriented x3 and is a full capacity to make this decision.  I have evaluated the medical decision-making capacity of this patient at bedside and have determined that  he/she is definitely/probably in/capable based on the following criteria:  1) Is able to understand the active medical problem 2) Is able to understand the proposed treatment plan 3) Is able to understand alternatives to the proposed treatment plan 4) Is able to understand the option of refusing the proposed treatment plan 5) Is able to appreciate the foreseeable consequences of accepting and refusing the proposed treatment plan  Additionally, the patient's decision does not appear to influenced by any sign of depression or delusion/psychosis.   Time taken to administer capacity evaluation: 5 minutes  Surrogate decision maker: no   Discharge Diagnoses:  Active Problems:   Rhabdomyolysis  Principal discharge diagnosis: Atraumatic rhabdomyolysis.  No Known Allergies  Consultations:  None   Procedures/Studies: DG Chest 2 View  Result Date: 02/10/2020 CLINICAL DATA:  Chest pain EXAM: CHEST - 2 VIEW COMPARISON:  07/17/2016 FINDINGS: The heart size and mediastinal contours are within normal limits. Both lungs are clear. The visualized skeletal structures are unremarkable. IMPRESSION: No active cardiopulmonary disease. Electronically Signed   By: Marnee Spring M.D.   On: 02/10/2020 06:08     Discharge Exam: Vitals:   02/12/20 2005 02/13/20 0546  BP: 124/83 120/83  Pulse: (!) 53 (!) 46  Resp: 16 17  Temp: 98.4 F (36.9 C) 98.5 F (36.9 C)  SpO2: 100% 96%   Vitals:   02/12/20 0601 02/12/20 1438 02/12/20 2005 02/13/20 0546  BP: 119/80 119/85 124/83 120/83  Pulse: (!) 49 (!) 49 (!) 53 (!) 46  Resp: 16 16 16 17   Temp: 97.8 F (36.6 C)  98.4  F (36.9 C) 98.5 F (36.9 C)  TempSrc: Oral  Oral Oral  SpO2: 100% 98% 100% 96%  Weight:      Height:        General: Pt is alert, awake, not in acute distress Cardiovascular: RRR, S1/S2 +, no rubs, no gallops Respiratory: CTA bilaterally, no wheezing, no rhonchi Abdominal: Soft, NT, ND, bowel sounds + Extremities: no edema,  no cyanosis    The results of significant diagnostics from this hospitalization (including imaging, microbiology, ancillary and laboratory) are listed below for reference.     Microbiology: Recent Results (from the past 240 hour(s))  SARS Coronavirus 2 by RT PCR (hospital order, performed in [facility] hospital lab) Nasopharyngeal Nasopharyngeal Swab     Status: None   Collection Time: 02/10/20  9:34 AM   Specimen: Nasopharyngeal Swab  Result Value Ref Range Status   SARS Coronavirus 2 NEGATIVE NEGATIVE Final    Comment: (NOTE) SARS-CoV-2 target nucleic acids are NOT DETECTED. The SARS-CoV-2 RNA is generally detectable in upper and lower respiratory specimens during the acute phase of infection. The lowest concentration of SARS-CoV-2 viral copies this assay can detect is 250 copies / mL. A negative result does not preclude SARS-CoV-2 infection and should not be used as the sole basis for treatment or other patient management decisions.  A negative result may occur with improper specimen collection / handling, submission of specimen other than nasopharyngeal swab, presence of viral mutation(s) within the areas targeted by this assay, and inadequate number of viral copies (<250 copies / mL). A negative result must be combined with clinical observations, patient history, and epidemiological information. Fact Sheet for Patients:   StrictlyIdeas.no Fact Sheet for Healthcare Providers: BankingDealers.co.za This test is not yet approved or cleared  by the [place] FDA and has been authorized for detection and/or diagnosis of SARS-CoV-2 by FDA under an Emergency Use Authorization (EUA).  This EUA will remain in effect (meaning this test can be used) for the duration of the COVID-19 declaration under Section 564(b)(1) of the Act, 21 U.S.C. section 360bbb-3(b)(1), unless the authorization is terminated or revoked sooner. Performed at  [facility], [address], [place], [place] [address]      Labs: BNP (last 3 results) No results for input(s): BNP in the last 8760 hours. Basic Metabolic Panel: Recent Labs  Lab 02/10/20 0632 02/11/20 0503 02/12/20 0651 02/13/20 0525  NA 137 140 139 138  K 3.6 3.9 3.8 3.4*  CL 102 110 107 104  CO2 25 26 26 26   GLUCOSE 102* 87 93 105*  BUN 9 5* <5* <5*  CREATININE 0.92 0.86 0.80 0.80  CALCIUM 8.7* 8.0* 8.2* 8.7*   Liver Function Tests: Recent Labs  Lab 02/11/20 0503 02/12/20 0651 02/13/20 0525  AST 110* 123* 124*  ALT 50* 56* 66*  ALKPHOS 54 59 66  BILITOT 0.9 0.9 0.9  PROT 6.0* 6.2* 7.0  ALBUMIN 3.3* 3.4* 3.8   No results for input(s): LIPASE, AMYLASE in the last 168 hours. No results for input(s): AMMONIA in the last 168 hours. CBC: Recent Labs  Lab 02/10/20 1236  WBC 4.6  HGB 13.3  HCT 39.6  MCV 95.4  PLT 229   Cardiac Enzymes: Recent Labs  Lab 02/10/20 0632 02/11/20 0503 02/12/20 0651 02/13/20 0525  CKTOTAL 20,251* 19,316* 20,128* 18,531*   BNP: Invalid input(s): POCBNP CBG: No results for input(s): GLUCAP in the last 168 hours. D-Dimer No results for input(s): DDIMER in the last 72 hours. Hgb A1c  No results for input(s): HGBA1C in the last 72 hours. Lipid Profile No results for input(s): CHOL, HDL, LDLCALC, TRIG, CHOLHDL, LDLDIRECT in the last 72 hours. Thyroid function studies No results for input(s): TSH, T4TOTAL, T3FREE, THYROIDAB in the last 72 hours.  Invalid input(s): FREET3 Anemia work up No results for input(s): VITAMINB12, FOLATE, FERRITIN, TIBC, IRON, RETICCTPCT in the last 72 hours. Urinalysis    Component Value Date/Time   COLORURINE AMBER (A) 01/21/2020 1002   APPEARANCEUR CLEAR 01/21/2020 1002   LABSPEC 1.015 01/21/2020 1002   PHURINE 6.0 01/21/2020 1002   GLUCOSEU NEGATIVE 01/21/2020 1002   HGBUR NEGATIVE 01/21/2020 1002   BILIRUBINUR NEGATIVE 01/21/2020 1002   KETONESUR NEGATIVE 01/21/2020 1002   PROTEINUR  NEGATIVE 01/21/2020 1002   UROBILINOGEN 1.0 06/17/2009 2137   NITRITE POSITIVE (A) 01/21/2020 1002   LEUKOCYTESUR NEGATIVE 01/21/2020 1002   Sepsis Labs Invalid input(s): PROCALCITONIN,  WBC,  LACTICIDVEN Microbiology Recent Results (from the past 240 hour(s))  SARS Coronavirus 2 by RT PCR (hospital order, performed in [facility] hospital lab) Nasopharyngeal Nasopharyngeal Swab     Status: None   Collection Time: 02/10/20  9:34 AM   Specimen: Nasopharyngeal Swab  Result Value Ref Range Status   SARS Coronavirus 2 NEGATIVE NEGATIVE Final    Comment: (NOTE) SARS-CoV-2 target nucleic acids are NOT DETECTED. The SARS-CoV-2 RNA is generally detectable in upper and lower respiratory specimens during the acute phase of infection. The lowest concentration of SARS-CoV-2 viral copies this assay can detect is 250 copies / mL. A negative result does not preclude SARS-CoV-2 infection and should not be used as the sole basis for treatment or other patient management decisions.  A negative result may occur with improper specimen collection / handling, submission of specimen other than nasopharyngeal swab, presence of viral mutation(s) within the areas targeted by this assay, and inadequate number of viral copies (<250 copies / mL). A negative result must be combined with clinical observations, patient history, and epidemiological information. Fact Sheet for Patients:   BoilerBrush.com.cy Fact Sheet for Healthcare Providers: https://pope.com/ This test is not yet approved or cleared  by the [place] FDA and has been authorized for detection and/or diagnosis of SARS-CoV-2 by FDA under an Emergency Use Authorization (EUA).  This EUA will remain in effect (meaning this test can be used) for the duration of the COVID-19 declaration under Section 564(b)(1) of the Act, 21 U.S.C. section 360bbb-3(b)(1), unless the authorization is terminated  or revoked sooner. Performed at [facility], [address], [place], [place] [address]      Time coordinating discharge: 35 minutes  SIGNED:   Erick Blinks, DO [facility] 02/13/2020, 10:52 AM  If 7PM-7AM, please contact night-coverage [email or web address]

## 2020-02-13 NOTE — Plan of Care (Signed)
Patient left AMA. Benefits and consequences including death were explained to patient. He still made decision to leave AMA. MD aware. His iv was removed and AMA form was signed.

## 2020-02-14 ENCOUNTER — Other Ambulatory Visit: Payer: Self-pay

## 2020-02-14 ENCOUNTER — Emergency Department (HOSPITAL_COMMUNITY)
Admission: EM | Admit: 2020-02-14 | Discharge: 2020-02-15 | Disposition: A | Discharge status: Home / Self Care | Payer: Self-pay | Attending: Emergency Medicine | Admitting: Emergency Medicine

## 2020-02-14 ENCOUNTER — Encounter (HOSPITAL_COMMUNITY): Payer: Self-pay

## 2020-02-14 DIAGNOSIS — Z5321 Procedure and treatment not carried out due to patient leaving prior to being seen by health care provider: Secondary | ICD-10-CM | POA: Insufficient documentation

## 2020-02-14 NOTE — ED Triage Notes (Signed)
Pt arrives POV for eval of Rhabdo after leaving [facility] A[facility] after being admitted for rhabdo. Pt reports that the IV pumps kept beeping air in line and that made him nervous so he left and came here. Pt reports he has been urinating CYU and feeling better overall.

## 2020-02-15 ENCOUNTER — Emergency Department (HOSPITAL_COMMUNITY)
Admission: EM | Admit: 2020-02-15 | Discharge: 2020-02-15 | Disposition: A | Discharge status: Home / Self Care | Payer: Self-pay | Attending: Emergency Medicine | Admitting: Emergency Medicine

## 2020-02-15 ENCOUNTER — Other Ambulatory Visit: Payer: Self-pay

## 2020-02-15 ENCOUNTER — Encounter (HOSPITAL_COMMUNITY): Payer: Self-pay | Admitting: Emergency Medicine

## 2020-02-15 DIAGNOSIS — R899 Unspecified abnormal finding in specimens from other organs, systems and tissues: Secondary | ICD-10-CM

## 2020-02-15 DIAGNOSIS — R748 Abnormal levels of other serum enzymes: Secondary | ICD-10-CM | POA: Insufficient documentation

## 2020-02-15 DIAGNOSIS — F17210 Nicotine dependence, cigarettes, uncomplicated: Secondary | ICD-10-CM | POA: Insufficient documentation

## 2020-02-15 DIAGNOSIS — Z8739 Personal history of other diseases of the musculoskeletal system and connective tissue: Secondary | ICD-10-CM | POA: Insufficient documentation

## 2020-02-15 LAB — CBC WITH DIFFERENTIAL/PLATELET
Abs Immature Granulocytes: 0.01 10*3/uL (ref 0.00–0.07)
Basophils Absolute: 0 10*3/uL (ref 0.0–0.1)
Basophils Relative: 1 %
Eosinophils Absolute: 0.2 10*3/uL (ref 0.0–0.5)
Eosinophils Relative: 3 %
HCT: 44.4 % (ref 39.0–52.0)
Hemoglobin: 15.6 g/dL (ref 13.0–17.0)
Immature Granulocytes: 0 %
Lymphocytes Relative: 57 %
Lymphs Abs: 3.7 10*3/uL (ref 0.7–4.0)
MCH: 33.2 pg (ref 26.0–34.0)
MCHC: 35.1 g/dL (ref 30.0–36.0)
MCV: 94.5 fL (ref 80.0–100.0)
Monocytes Absolute: 0.6 10*3/uL (ref 0.1–1.0)
Monocytes Relative: 10 %
Neutro Abs: 1.8 10*3/uL (ref 1.7–7.7)
Neutrophils Relative %: 29 %
Platelets: 273 10*3/uL (ref 150–400)
RBC: 4.7 MIL/uL (ref 4.22–5.81)
RDW: 13 % (ref 11.5–15.5)
WBC: 6.4 10*3/uL (ref 4.0–10.5)
nRBC: 0 % (ref 0.0–0.2)

## 2020-02-15 LAB — URINALYSIS, ROUTINE W REFLEX MICROSCOPIC
Bilirubin Urine: NEGATIVE
Glucose, UA: NEGATIVE mg/dL
Hgb urine dipstick: NEGATIVE
Ketones, ur: NEGATIVE mg/dL
Leukocytes,Ua: NEGATIVE
Nitrite: NEGATIVE
Protein, ur: NEGATIVE mg/dL
Specific Gravity, Urine: <1.005 — ABNORMAL LOW (ref 1.005–1.030)
pH: 6.5 (ref 5.0–8.0)

## 2020-02-15 LAB — COMPREHENSIVE METABOLIC PANEL
ALT: 61 U/L — ABNORMAL HIGH (ref 0–44)
AST: 63 U/L — ABNORMAL HIGH (ref 15–41)
Albumin: 4.2 g/dL (ref 3.5–5.0)
Alkaline Phosphatase: 74 U/L (ref 38–126)
Anion gap: 10 (ref 5–15)
BUN: 8 mg/dL (ref 6–20)
CO2: 25 mmol/L (ref 22–32)
Calcium: 9.1 mg/dL (ref 8.9–10.3)
Chloride: 103 mmol/L (ref 98–111)
Creatinine, Ser: 0.95 mg/dL (ref 0.61–1.24)
GFR calc Af Amer: >60 mL/min (ref >60)
GFR calc non Af Amer: >60 mL/min (ref >60)
Glucose, Bld: 80 mg/dL (ref 70–99)
Potassium: 3.6 mmol/L (ref 3.5–5.1)
Sodium: 138 mmol/L (ref 135–145)
Total Bilirubin: 0.4 mg/dL (ref 0.3–1.2)
Total Protein: 7.4 g/dL (ref 6.5–8.1)

## 2020-02-15 LAB — CK: Total CK: 2537 U/L — ABNORMAL HIGH (ref 49–397)

## 2020-02-15 MED ORDER — SODIUM CHLORIDE 0.9 % IV BOLUS: 1000.0000 mL | Freq: Once | INTRAVENOUS | Status: DC

## 2020-02-15 NOTE — ED Provider Notes (Signed)
[facility] EMERGENCY DEPARTMENT Provider Note   CSN: [id number] Arrival date & time: 02/15/20  0353     History Chief Complaint  Patient presents with  . kidney issues    Nathaniel Holt is a 29 y.o. male.  HPI     This is a 29 year old male with a history of bipolar disorder and recent rhabdomyolysis who presents with concerns for elevated CK.  Patient was seen and admitted several days ago for rhabdomyolysis.  He left AGAINST MEDICAL ADVICE on Sunday with a CK of 18 531.  Patient states that he has been aggressively hydrated at home.  He feels well.  He denies any body aches or pains.  Reports good urination and good urine output.  Has not had any fevers.  He states that he was really nervous about being in the hospital but after leaving felt remorseful that he may get worse.  Currently he is asymptomatic.  Chart reviewed.  Patient aggressively hydrated with CK that leveled out at 18,000-20000.  Patient attempted to be seen at [facility] and lab work was redrawn yesterday evening approximately 6 hours ago.  Repeat CK 2537 with normal renal function and urinalysis.  Past Medical History:  Diagnosis Date  . Bipolar disorder (HCC)   . Schizophrenia [facility])     Patient Active Problem List   Diagnosis Date Noted  . Rhabdomyolysis 02/10/2020    Past Surgical History:  Procedure Laterality Date  . LEG SURGERY         History reviewed. No pertinent family history.  Social History   Tobacco Use  . Smoking status: Current Every Day Smoker    Packs/day: 1.00    Types: Cigarettes  . Smokeless tobacco: Never Used  Substance Use Topics  . Alcohol use: No  . Drug use: No    Home Medications Prior to Admission medications   Not on File    Allergies    Patient has no known allergies.  Review of Systems   Review of Systems  Constitutional: Negative for fever.  Respiratory: Negative for shortness of breath.   Cardiovascular: Negative for chest pain.    Gastrointestinal: Negative for abdominal pain, nausea and vomiting.  Genitourinary: Negative for difficulty urinating.  Musculoskeletal: Negative for myalgias.  All other systems reviewed and are negative.   Physical Exam Updated Vital Signs BP (!) 141/100 (BP Location: Right Arm)   Pulse (!) 103   Temp 97.9 F (36.6 C) (Oral)   Resp 18   Ht 1.753 m (5\' 9" )   Wt 72.6 kg   SpO2 100%   BMI 23.63 kg/m   Physical Exam Vitals and nursing note reviewed.  Constitutional:      Appearance: He is well-developed. He is not ill-appearing.  HENT:     Head: Normocephalic and atraumatic.     Nose: Nose normal.     Mouth/Throat:     Mouth: Mucous membranes are moist.  Eyes:     Pupils: Pupils are equal, round, and reactive to light.  Cardiovascular:     Rate and Rhythm: Normal rate and regular rhythm.  Pulmonary:     Effort: Pulmonary effort is normal. No respiratory distress.  Musculoskeletal:     Cervical back: Neck supple.  Lymphadenopathy:     Cervical: No cervical adenopathy.  Skin:    General: Skin is warm and dry.  Neurological:     Mental Status: He is alert and oriented to person, place, and time.  Psychiatric:  Mood and Affect: Mood normal.     ED Results / Procedures / Treatments   Labs (all labs ordered are listed, but only abnormal results are displayed) Labs Reviewed - No data to display  EKG None  Radiology No results found.  Procedures Procedures (including critical care time)  Medications Ordered in ED Medications - No data to display  ED Course  I have reviewed the triage vital signs and the nursing notes.  Pertinent labs & imaging results that were available during my care of the patient were reviewed by me and considered in my medical decision making (see chart for details).    MDM Rules/Calculators/A&P                       Patient presents after leaving AMA on Sunday with concerns that he may be getting worse.  Physically he  states he feels better.  Vital signs reviewed and largely reassuring.  Reports aggressive hydration at home.  Lab work reviewed from [facility] with improvement of CK into the 2000s.  This is very reassuring.  Renal function is preserved and patient states he is having good urine output.  Patient was reassured.  I did offer him a liter of fluids to continue to help him going in the right direction.  However, it is very reassuring that he has been able to downtrend with oral hydration.  Patient declines further fluids and states that he feels much better.  Do not feel he needs further work-up at this time.  After history, exam, and medical workup I feel the patient has been appropriately medically screened and is safe for discharge home. Pertinent diagnoses were discussed with the patient. Patient was given return precautions.   Final Clinical Impression(s) / ED Diagnoses Final diagnoses:  Abnormal laboratory test  History of rhabdomyolysis    Rx / DC Orders ED Discharge Orders    None       Emonnie Cannady, Barbette Hair, MD 02/15/20 [phone]

## 2020-02-15 NOTE — ED Triage Notes (Signed)
Patient here due to kidney issues. Patient was here on the 7th for an evaluation of Rhabdo. Patient diagnosed and admitted for [facility] but left AMA due to anxiety of being in the hospital.

## 2020-02-15 NOTE — ED Notes (Signed)
Pt advised against checking out. Pt checked out. 

## 2020-02-15 NOTE — Discharge Instructions (Addendum)
You were seen today for recheck of your labs.  Your labs are trending in the correct direction.  Continue to hydrate aggressively and avoid excessive exercise over the next week.  Follow-up with primary physician for recheck of CK in 3 to 4 days.

## 2020-05-13 ENCOUNTER — Encounter (HOSPITAL_COMMUNITY): Payer: Self-pay

## 2020-05-13 ENCOUNTER — Emergency Department (HOSPITAL_COMMUNITY)
Admission: EM | Admit: 2020-05-13 | Discharge: 2020-05-13 | Disposition: A | Discharge status: Home / Self Care | Payer: Self-pay | Attending: Emergency Medicine | Admitting: Emergency Medicine

## 2020-05-13 ENCOUNTER — Other Ambulatory Visit: Payer: Self-pay

## 2020-05-13 DIAGNOSIS — R350 Frequency of micturition: Secondary | ICD-10-CM

## 2020-05-13 DIAGNOSIS — F17200 Nicotine dependence, unspecified, uncomplicated: Secondary | ICD-10-CM | POA: Insufficient documentation

## 2020-05-13 HISTORY — DX: Rhabdomyolysis: M62.82

## 2020-05-13 LAB — CBC WITH DIFFERENTIAL/PLATELET
Abs Immature Granulocytes: 0.01 10*3/uL (ref 0.00–0.07)
Basophils Absolute: 0.1 10*3/uL (ref 0.0–0.1)
Basophils Relative: 1 %
Eosinophils Absolute: 0.2 10*3/uL (ref 0.0–0.5)
Eosinophils Relative: 3 %
HCT: 45.8 % (ref 39.0–52.0)
Hemoglobin: 15.6 g/dL (ref 13.0–17.0)
Immature Granulocytes: 0 %
Lymphocytes Relative: 53 %
Lymphs Abs: 3.4 10*3/uL (ref 0.7–4.0)
MCH: 32.5 pg (ref 26.0–34.0)
MCHC: 34.1 g/dL (ref 30.0–36.0)
MCV: 95.4 fL (ref 80.0–100.0)
Monocytes Absolute: 0.4 10*3/uL (ref 0.1–1.0)
Monocytes Relative: 6 %
Neutro Abs: 2.4 10*3/uL (ref 1.7–7.7)
Neutrophils Relative %: 37 %
Platelets: 273 10*3/uL (ref 150–400)
RBC: 4.8 MIL/uL (ref 4.22–5.81)
RDW: 13.2 % (ref 11.5–15.5)
WBC: 6.5 10*3/uL (ref 4.0–10.5)
nRBC: 0 % (ref 0.0–0.2)

## 2020-05-13 LAB — URINALYSIS, ROUTINE W REFLEX MICROSCOPIC
Bilirubin Urine: NEGATIVE
Glucose, UA: NEGATIVE mg/dL
Hgb urine dipstick: NEGATIVE
Ketones, ur: 5 mg/dL — AB
Leukocytes,Ua: NEGATIVE
Nitrite: NEGATIVE
Protein, ur: NEGATIVE mg/dL
Specific Gravity, Urine: 1.018 (ref 1.005–1.030)
pH: 6 (ref 5.0–8.0)

## 2020-05-13 LAB — COMPREHENSIVE METABOLIC PANEL
ALT: 34 U/L (ref 0–44)
AST: 22 U/L (ref 15–41)
Albumin: 4.6 g/dL (ref 3.5–5.0)
Alkaline Phosphatase: 66 U/L (ref 38–126)
Anion gap: 9 (ref 5–15)
BUN: 8 mg/dL (ref 6–20)
CO2: 26 mmol/L (ref 22–32)
Calcium: 9.2 mg/dL (ref 8.9–10.3)
Chloride: 102 mmol/L (ref 98–111)
Creatinine, Ser: 0.76 mg/dL (ref 0.61–1.24)
GFR calc Af Amer: >60 mL/min (ref >60)
GFR calc non Af Amer: >60 mL/min (ref >60)
Glucose, Bld: 104 mg/dL — ABNORMAL HIGH (ref 70–99)
Potassium: 3.8 mmol/L (ref 3.5–5.1)
Sodium: 137 mmol/L (ref 135–145)
Total Bilirubin: 0.7 mg/dL (ref 0.3–1.2)
Total Protein: 8.2 g/dL — ABNORMAL HIGH (ref 6.5–8.1)

## 2020-05-13 LAB — CK: Total CK: 364 U/L (ref 49–397)

## 2020-05-13 MED ORDER — SODIUM CHLORIDE 0.9 % IV BOLUS
1000.0000 mL | Freq: Once | INTRAVENOUS | Status: AC
  Administered 2020-05-13: 1000 mL via INTRAVENOUS

## 2020-05-13 NOTE — Discharge Instructions (Addendum)
Follow-up with [facility] if she continues having problems urinating

## 2020-05-13 NOTE — ED Provider Notes (Signed)
[facility] EMERGENCY DEPARTMENT Provider Note   CSN: [id number] Arrival date & time: 05/13/20  0555     History Chief Complaint  Patient presents with  . Urinary Urgency  . Follow-up    Nathaniel Holt is a 29 y.o. male.  Patient complains of urinary frequency.  Patient also was diagnosed with rhabdo 3 months ago and was told to follow back up but he never did  The history is provided by the patient. No language interpreter was used.  Urinary Frequency This is a new problem. The current episode started more than 2 days ago. The problem occurs constantly. The problem has not changed since onset.Pertinent negatives include no chest pain, no abdominal pain and no headaches. Nothing aggravates the symptoms. Nothing relieves the symptoms. He has tried nothing for the symptoms. The treatment provided no relief.       Past Medical History:  Diagnosis Date  . Bipolar disorder (HCC)   . Rhabdomyolysis   . Schizophrenia [facility])     Patient Active Problem List   Diagnosis Date Noted  . Rhabdomyolysis 02/10/2020    Past Surgical History:  Procedure Laterality Date  . LEG SURGERY         No family history on file.  Social History   Tobacco Use  . Smoking status: Current Every Day Smoker    Packs/day: 1.00    Types: Cigarettes  . Smokeless tobacco: Never Used  Vaping Use  . Vaping Use: Never used  Substance Use Topics  . Alcohol use: No  . Drug use: No    Home Medications Prior to Admission medications   Not on File    Allergies    Patient has no known allergies.  Review of Systems   Review of Systems  Constitutional: Negative for appetite change and fatigue.  HENT: Negative for congestion, ear discharge and sinus pressure.   Eyes: Negative for discharge.  Respiratory: Negative for cough.   Cardiovascular: Negative for chest pain.  Gastrointestinal: Negative for abdominal pain and diarrhea.  Genitourinary: Positive for frequency. Negative for hematuria.    Musculoskeletal: Negative for back pain.  Skin: Negative for rash.  Neurological: Negative for seizures and headaches.  Psychiatric/Behavioral: Negative for hallucinations.    Physical Exam Updated Vital Signs BP (!) 134/96 (BP Location: Right Arm)   Pulse 65   Temp 99.4 F (37.4 C) (Oral)   Resp 18   SpO2 100%   Physical Exam Vitals reviewed.  Constitutional:      Appearance: He is well-developed.  HENT:     Head: Normocephalic.     Nose: Nose normal.  Eyes:     General: No scleral icterus.    Conjunctiva/sclera: Conjunctivae normal.  Neck:     Thyroid: No thyromegaly.  Cardiovascular:     Rate and Rhythm: Normal rate and regular rhythm.     Heart sounds: No murmur heard.  No friction rub. No gallop.   Pulmonary:     Breath sounds: No stridor. No wheezing or rales.  Chest:     Chest wall: No tenderness.  Abdominal:     General: There is no distension.     Tenderness: There is no abdominal tenderness. There is no rebound.  Musculoskeletal:        General: Normal range of motion.     Cervical back: Neck supple.  Lymphadenopathy:     Cervical: No cervical adenopathy.  Skin:    Findings: No erythema or rash.  Neurological:  Mental Status: He is alert and oriented to person, place, and time.     Motor: No abnormal muscle tone.     Coordination: Coordination normal.  Psychiatric:        Behavior: Behavior normal.     ED Results / Procedures / Treatments   Labs (all labs ordered are listed, but only abnormal results are displayed) Labs Reviewed  URINALYSIS, ROUTINE W REFLEX MICROSCOPIC - Abnormal; Notable for the following components:      Result Value   Ketones, ur 5 (*)    All other components within normal limits  COMPREHENSIVE METABOLIC PANEL - Abnormal; Notable for the following components:   Glucose, Bld 104 (*)    Total Protein 8.2 (*)    All other components within normal limits  CBC WITH DIFFERENTIAL/PLATELET  CK     EKG None  Radiology No results found.  Procedures Procedures (including critical care time)  Medications Ordered in ED Medications  sodium chloride 0.9 % bolus 1,000 mL (1,000 mLs Intravenous New Bag/Given 05/13/20 1315)    ED Course  I have reviewed the triage vital signs and the nursing notes.  Pertinent labs & imaging results that were available during my care of the patient were reviewed by me and considered in my medical decision making (see chart for details).    MDM Rules/Calculators/A&P                          Patient with urinary frequency.  Urinalysis is negative.  Patient also had a history of rhabdo before but was never rechecked.  Labs in our normal.  Patient will follow up with urology       This patient presents to the ED for concern of urinary frequency, this involves an extensive number of treatment options, and is a complaint that carries with it a high risk of complications and morbidity.  The differential diagnosis includes diabetes UTI   Lab Tests:   I Ordered, reviewed, and interpreted labs, which included CBC chemistries urinalysis  Medicines ordered:   I ordered medication no medicines  Imaging Studies ordered:     Additional history obtained:   Additional history obtained from records  Previous records obtained and reviewed.  Consultations Obtained:     Reevaluation:  After the interventions stated above, I reevaluated the patient and found no change  Critical Interventions:  .   Final Clinical Impression(s) / ED Diagnoses Final diagnoses:  Urinary frequency    Rx / DC Orders ED Discharge Orders    None       Bethann Berkshire, MD 05/16/20 1028

## 2020-05-13 NOTE — ED Triage Notes (Signed)
Pt states he needs a note to go back to work. Pt was seen 02/15/2020 for rhabdo and is here for follow up. Says he was suppose to be back by now but hadnt followed up. He has not gone back to work yet. Also reports when he has to urinate unable to get much out. Denies burning or penile discharge

## 2020-05-21 ENCOUNTER — Other Ambulatory Visit: Payer: Self-pay

## 2020-05-21 ENCOUNTER — Emergency Department (HOSPITAL_COMMUNITY)
Admission: EM | Admit: 2020-05-21 | Discharge: 2020-05-21 | Disposition: A | Discharge status: Home / Self Care | Payer: Self-pay | Attending: Emergency Medicine | Admitting: Emergency Medicine

## 2020-05-21 ENCOUNTER — Encounter (HOSPITAL_COMMUNITY): Payer: Self-pay | Admitting: Emergency Medicine

## 2020-05-21 DIAGNOSIS — F17210 Nicotine dependence, cigarettes, uncomplicated: Secondary | ICD-10-CM | POA: Insufficient documentation

## 2020-05-21 DIAGNOSIS — H6012 Cellulitis of left external ear: Secondary | ICD-10-CM | POA: Insufficient documentation

## 2020-05-21 DIAGNOSIS — J029 Acute pharyngitis, unspecified: Secondary | ICD-10-CM | POA: Insufficient documentation

## 2020-05-21 LAB — GROUP A STREP BY PCR: Group A Strep by PCR: NOT DETECTED

## 2020-05-21 MED ORDER — DOXYCYCLINE HYCLATE 100 MG PO CAPS: 100.0000 mg | ORAL_CAPSULE | Freq: Two times a day (BID) | ORAL | 0 refills | Status: DC

## 2020-05-21 NOTE — Discharge Instructions (Signed)
Soak area 20 minutes 4 times a day °

## 2020-05-21 NOTE — ED Triage Notes (Signed)
Pt c/o pain and swelling to LT ear lobe. Pt denies recent injury or piercing. Pt also c/o sore throat.

## 2020-05-21 NOTE — ED Provider Notes (Signed)
[facility] EMERGENCY DEPARTMENT Provider Note   CSN: [id number] Arrival date & time: 05/21/20  1424     History Chief Complaint  Patient presents with  . Ear Problem    Nathaniel Holt is a 29 y.o. male.  Pt complains of swelling to his left ear lobe.  Pt also complains of a sore throat. No cough or fever Pt denies any injury to ear.   The history is provided by the patient. No language interpreter was used.       Past Medical History:  Diagnosis Date  . Bipolar disorder (HCC)   . Rhabdomyolysis   . Schizophrenia [facility])     Patient Active Problem List   Diagnosis Date Noted  . Rhabdomyolysis 02/10/2020    Past Surgical History:  Procedure Laterality Date  . LEG SURGERY         No family history on file.  Social History   Tobacco Use  . Smoking status: Current Every Day Smoker    Packs/day: 1.00    Types: Cigarettes  . Smokeless tobacco: Never Used  Vaping Use  . Vaping Use: Never used  Substance Use Topics  . Alcohol use: No  . Drug use: No    Home Medications Prior to Admission medications   Medication Sig Start Date End Date Taking? Authorizing Provider  doxycycline (VIBRAMYCIN) 100 MG capsule Take 1 capsule (100 mg total) by mouth 2 (two) times daily. 05/21/20   Elson Areas, PA-C    Allergies    Patient has no known allergies.  Review of Systems   Review of Systems  HENT: Positive for ear pain.   All other systems reviewed and are negative.   Physical Exam Updated Vital Signs BP 123/80 (BP Location: Left Arm)   Pulse 70   Temp 99.2 F (37.3 C) (Oral)   Resp 18   Ht 5\' 9"  (1.753 m)   Wt 72.6 kg   SpO2 98%   BMI 23.63 kg/m   Physical Exam Vitals and nursing note reviewed.  Constitutional:      Appearance: He is well-developed.  HENT:     Head: Normocephalic.     Ears:     Comments: Left ear lobe swollen, tiny pustule back of ear, drained with palpation  Cardiovascular:     Rate and Rhythm: Normal rate.  Pulmonary:      Effort: Pulmonary effort is normal.  Abdominal:     General: There is no distension.  Musculoskeletal:        General: Normal range of motion.     Cervical back: Normal range of motion.  Neurological:     Mental Status: He is alert and oriented to person, place, and time.  Psychiatric:        Mood and Affect: Mood normal.     ED Results / Procedures / Treatments   Labs (all labs ordered are listed, but only abnormal results are displayed) Labs Reviewed  GROUP A STREP BY PCR    EKG None  Radiology No results found.  Procedures Procedures (including critical care time)  Medications Ordered in ED Medications - No data to display  ED Course  I have reviewed the triage vital signs and the nursing notes.  Pertinent labs & imaging results that were available during my care of the patient were reviewed by me and considered in my medical decision making (see chart for details).    MDM Rules/Calculators/A&P  MDM:  Strep is negative,  Pt counseled on infection.  I advised warm compresses.  Antibiotics.  Final Clinical Impression(s) / ED Diagnoses Final diagnoses:  Cellulitis of left earlobe    Rx / DC Orders ED Discharge Orders         Ordered    doxycycline (VIBRAMYCIN) 100 MG capsule  2 times daily        05/21/20 1720        An After Visit Summary was printed and given to the patient.    Elson Areas, [place] 05/21/20 1801    Linwood Dibbles, MD 05/23/20 0930

## 2020-06-29 ENCOUNTER — Encounter (HOSPITAL_COMMUNITY): Payer: Self-pay

## 2020-06-29 ENCOUNTER — Other Ambulatory Visit: Payer: Self-pay

## 2020-06-29 ENCOUNTER — Emergency Department (HOSPITAL_COMMUNITY)
Admission: EM | Admit: 2020-06-29 | Discharge: 2020-06-29 | Disposition: A | Discharge status: Home / Self Care | Payer: Self-pay | Attending: Emergency Medicine | Admitting: Emergency Medicine

## 2020-06-29 DIAGNOSIS — F17210 Nicotine dependence, cigarettes, uncomplicated: Secondary | ICD-10-CM | POA: Insufficient documentation

## 2020-06-29 DIAGNOSIS — L0501 Pilonidal cyst with abscess: Secondary | ICD-10-CM | POA: Insufficient documentation

## 2020-06-29 MED ORDER — IBUPROFEN 800 MG PO TABS
800.0000 mg | ORAL_TABLET | Freq: Once | ORAL | Status: AC
  Administered 2020-06-29: 800 mg via ORAL
  Filled 2020-06-29: qty 1

## 2020-06-29 MED ORDER — LIDOCAINE HCL (PF) 1 % IJ SOLN
10.0000 mL | Freq: Once | INTRAMUSCULAR | Status: AC
  Administered 2020-06-29: 10 mL
  Filled 2020-06-29: qty 30

## 2020-06-29 MED ORDER — DOXYCYCLINE HYCLATE 100 MG PO CAPS: 100.0000 mg | ORAL_CAPSULE | Freq: Two times a day (BID) | ORAL | 0 refills | Status: DC

## 2020-06-29 NOTE — ED Triage Notes (Signed)
Pt presents to ED with abscess on top of buttocks x 2 days. No fever

## 2020-06-29 NOTE — Discharge Instructions (Addendum)
Return if any problems.

## 2020-06-29 NOTE — ED Provider Notes (Signed)
[facility] EMERGENCY DEPARTMENT Provider Note   CSN: [id number] Arrival date & time: 06/29/20  1027     History Chief Complaint  Patient presents with   Abscess    ERIBERTO Holt is a 29 y.o. male.  The history is provided by the patient. No language interpreter was used.  Abscess Location:  Pelvis Pelvic abscess location:  R buttock Size:  2 Abscess quality: redness and warmth   Red streaking: no   Progression:  Worsening Chronicity:  New Relieved by:  Nothing Worsened by:  Nothing Ineffective treatments:  None tried Risk factors: prior abscess        Past Medical History:  Diagnosis Date   Bipolar disorder (HCC)    Rhabdomyolysis    Schizophrenia (HCC)     Patient Active Problem List   Diagnosis Date Noted   Rhabdomyolysis 02/10/2020    Past Surgical History:  Procedure Laterality Date   LEG SURGERY         No family history on file.  Social History   Tobacco Use   Smoking status: Current Every Day Smoker    Packs/day: 1.00    Types: Cigarettes   Smokeless tobacco: Never Used  Vaping Use   Vaping Use: Never used  Substance Use Topics   Alcohol use: No   Drug use: Yes    Types: Marijuana    Home Medications Prior to Admission medications   Medication Sig Start Date End Date Taking? Authorizing Provider  doxycycline (VIBRAMYCIN) 100 MG capsule Take 1 capsule (100 mg total) by mouth 2 (two) times daily. 05/21/20   Elson Areas, PA-C    Allergies    Patient has no known allergies.  Review of Systems   Review of Systems  All other systems reviewed and are negative.   Physical Exam Updated Vital Signs Ht 5\' 9"  (1.753 m)    Wt 72.6 kg    BMI 23.63 kg/m   Physical Exam Vitals reviewed.  Musculoskeletal:        General: Normal range of motion.  Skin:    General: Skin is warm.     Comments: 2cm round area right buttock, lateral to scarring from midline pilonidal sinus scar  Neurological:     General: No focal  deficit present.     Mental Status: He is alert.  Psychiatric:        Mood and Affect: Mood normal.     ED Results / Procedures / Treatments   Labs (all labs ordered are listed, but only abnormal results are displayed) Labs Reviewed  GC/CHLAMYDIA PROBE AMP ([place]) NOT AT [facility]    EKG None  Radiology No results found.  Procedures .[facility]age  Date/Time: 06/29/2020 10:49 AM Performed by: 07/01/2020, PA-C Authorized by: Elson Areas, PA-C   Consent:    Consent obtained:  Verbal   Consent given by:  Patient   Risks discussed:  Bleeding, incomplete drainage, pain and damage to other organs   Alternatives discussed:  No treatment Universal protocol:    Procedure explained and questions answered to patient or proxy's satisfaction: yes     Relevant documents present and verified: yes     Test results available and properly labeled: yes     Imaging studies available: yes     Required blood products, implants, devices, and special equipment available: yes     Site/side marked: yes     Immediately prior to procedure a time out was called: yes  Patient identity confirmed:  Verbally with patient Location:    Type:  Pilonidal cyst Pre-procedure details:    Skin preparation:  Betadine Anesthesia (see MAR for exact dosages):    Anesthesia method:  Local infiltration   Local anesthetic:  Lidocaine 1% w/o epi Procedure type:    Complexity:  Complex Procedure details:    Incision types:  Single straight   Incision depth:  Subcutaneous   Scalpel blade:  11   Wound management:  Probed and deloculated, irrigated with saline and extensive cleaning   Drainage:  Purulent   Drainage amount:  Moderate Post-procedure details:    Patient tolerance of procedure:  Tolerated well, no immediate complications   (including critical care time)  Medications Ordered in ED Medications  lidocaine (PF) (XYLOCAINE) 1 % injection 10 mL (has no administration in time  range)    ED Course  I have reviewed the triage vital signs and the nursing notes.  Pertinent labs & imaging results that were available during my care of the patient were reviewed by me and considered in my medical decision making (see chart for details).    MDM Rules/Calculators/A&P                          MDM:  Pt also request test for gc and ct.  Pt reports some burning with urination.   Pt given rx for doxycycline Final Clinical Impression(s) / ED Diagnoses Final diagnoses:  Pilonidal abscess    Rx / DC Orders ED Discharge Orders         Ordered    doxycycline (VIBRAMYCIN) 100 MG capsule  2 times daily        06/29/20 1050        An After Visit Summary was printed and given to the patient.    Elson Areas, PA-C 06/29/20 1051    Linwood Dibbles, MD 06/29/20 1818

## 2020-06-30 LAB — GC/CHLAMYDIA PROBE AMP (~~LOC~~) NOT AT ARMC
Chlamydia: NEGATIVE
Comment: NEGATIVE
Comment: NORMAL
Neisseria Gonorrhea: NEGATIVE

## 2020-12-08 IMAGING — DX DG CHEST 2V
2 series · 2 of 2 positions shown · non-contrast
Comparison: 07/17/2016

CLINICAL DATA: Chest pain

EXAM:
CHEST - 2 VIEW

[chest pa]
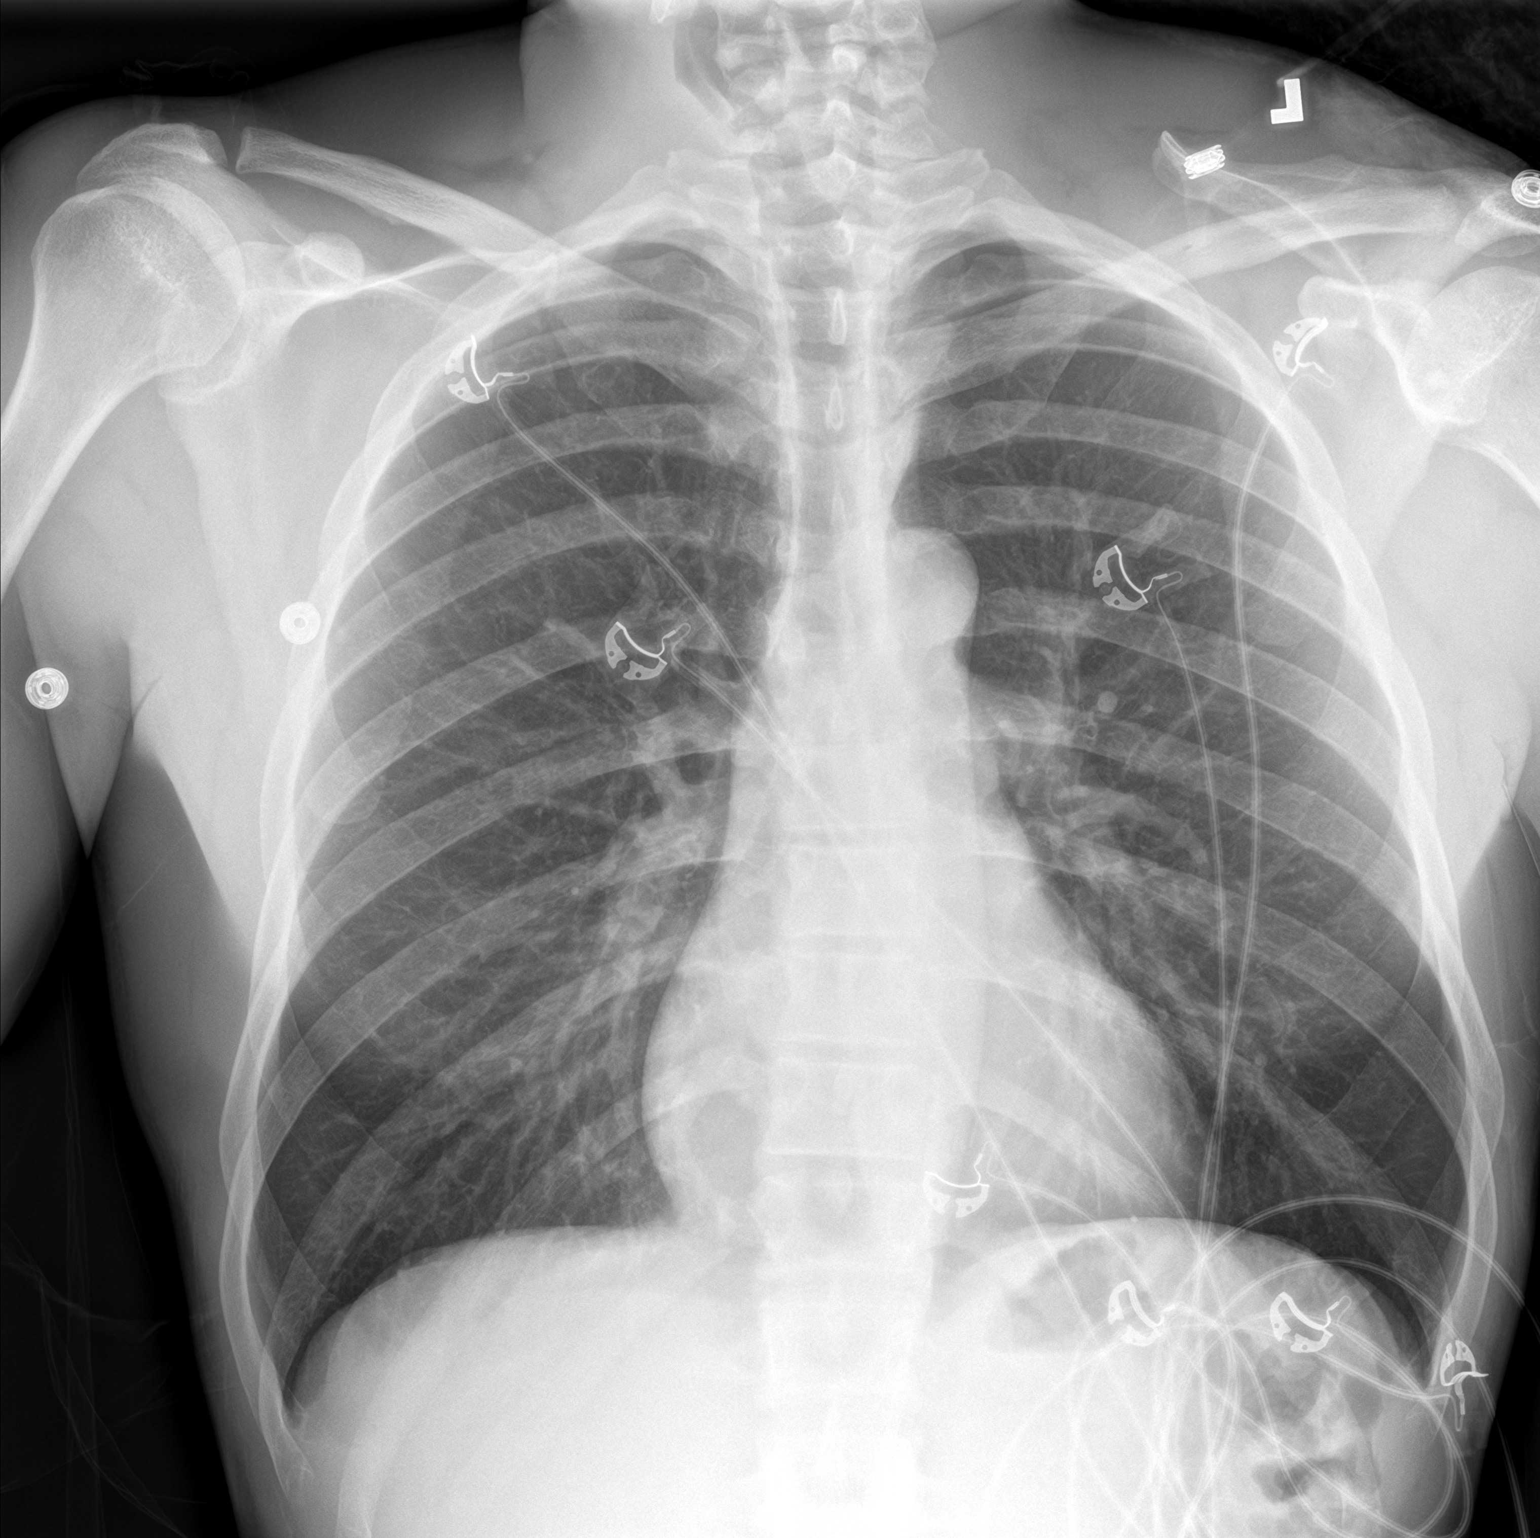

[chest lat]
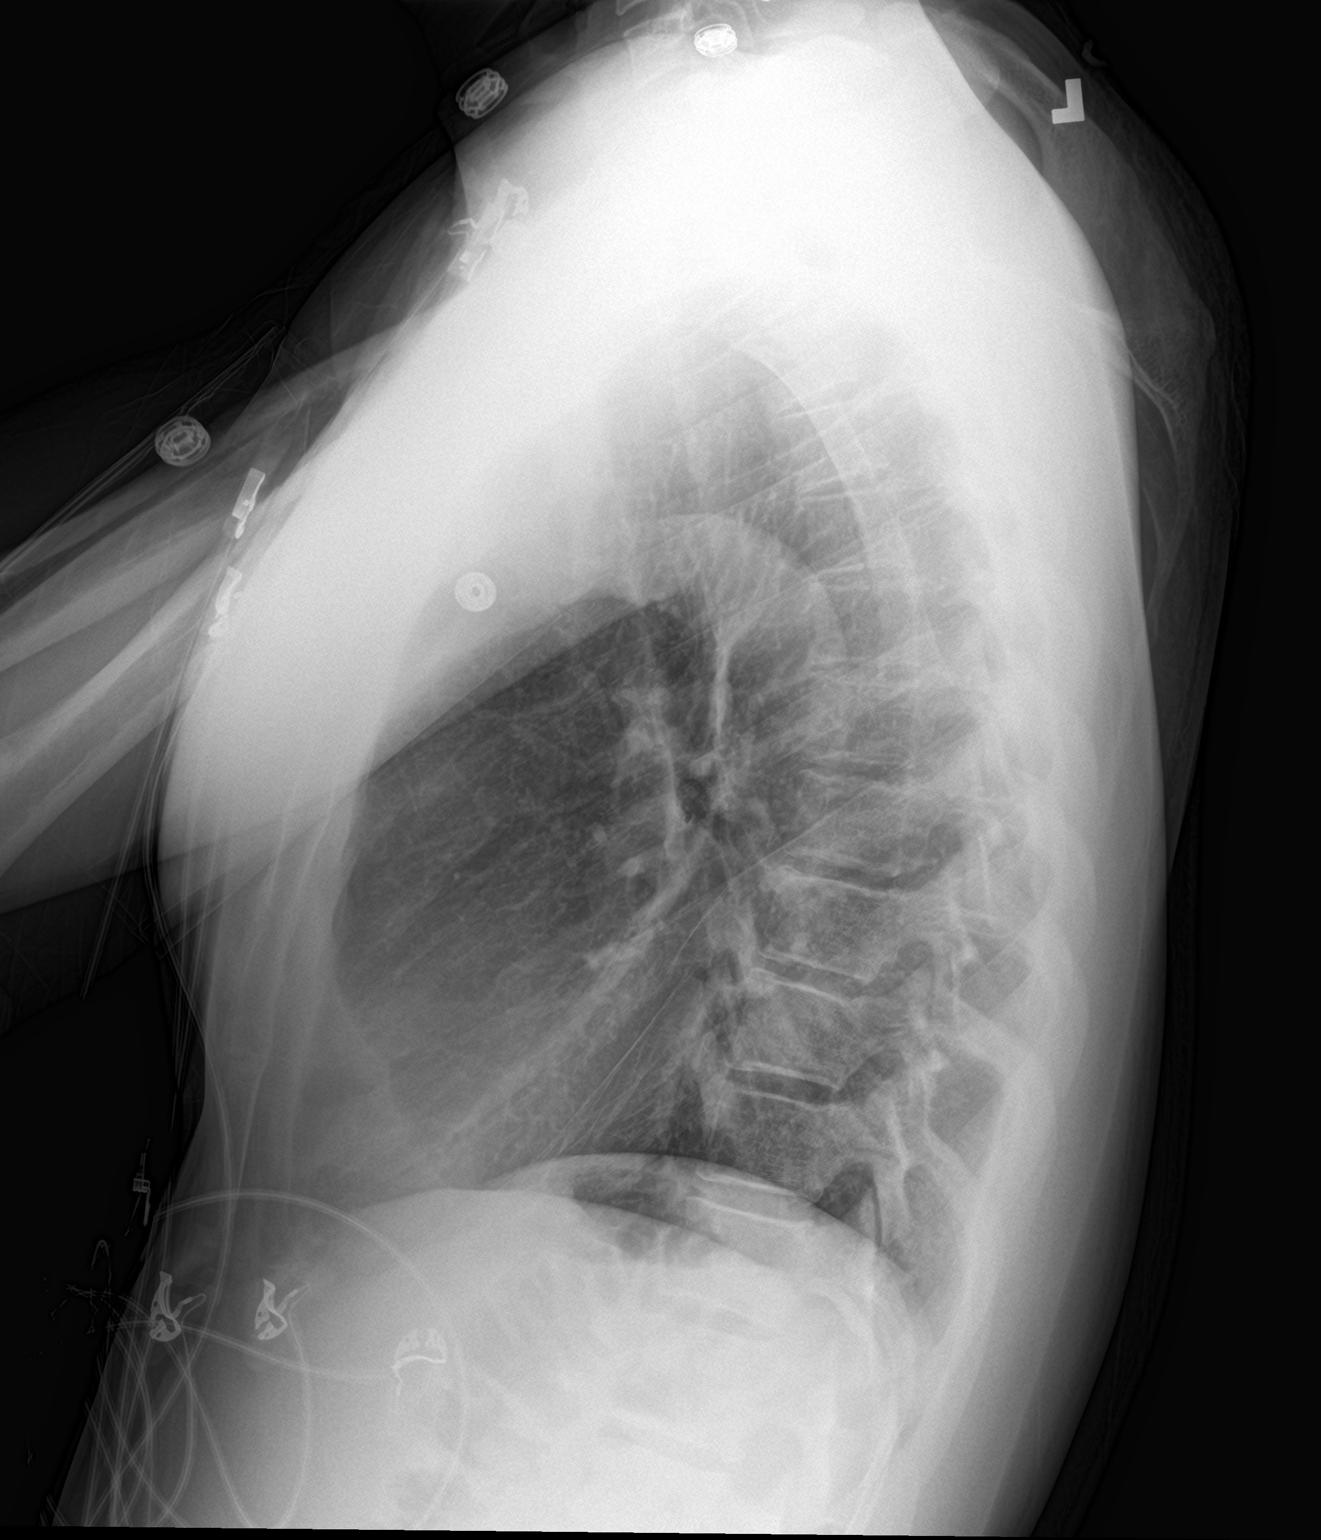

[2 of 2 positions shown; findings below may reference images not displayed]

FINDINGS: The heart size and mediastinal contours are within normal limits.
Both lungs are clear. The visualized skeletal structures are
unremarkable.
IMPRESSION: No active cardiopulmonary disease.

## 2020-12-12 ENCOUNTER — Other Ambulatory Visit: Payer: Self-pay

## 2020-12-12 ENCOUNTER — Emergency Department (HOSPITAL_COMMUNITY)
Admission: EM | Admit: 2020-12-12 | Discharge: 2020-12-12 | Disposition: A | Discharge status: Home / Self Care | Payer: Self-pay | Attending: Emergency Medicine | Admitting: Emergency Medicine

## 2020-12-12 ENCOUNTER — Encounter (HOSPITAL_COMMUNITY): Payer: Self-pay | Admitting: Emergency Medicine

## 2020-12-12 DIAGNOSIS — F17210 Nicotine dependence, cigarettes, uncomplicated: Secondary | ICD-10-CM | POA: Insufficient documentation

## 2020-12-12 DIAGNOSIS — R369 Urethral discharge, unspecified: Secondary | ICD-10-CM | POA: Insufficient documentation

## 2020-12-12 DIAGNOSIS — Z202 Contact with and (suspected) exposure to infections with a predominantly sexual mode of transmission: Secondary | ICD-10-CM

## 2020-12-12 LAB — URINALYSIS, ROUTINE W REFLEX MICROSCOPIC
Bilirubin Urine: NEGATIVE
Glucose, UA: NEGATIVE mg/dL
Hgb urine dipstick: NEGATIVE
Ketones, ur: NEGATIVE mg/dL
Leukocytes,Ua: NEGATIVE
Nitrite: NEGATIVE
Protein, ur: NEGATIVE mg/dL
Specific Gravity, Urine: 1.023 (ref 1.005–1.030)
pH: 7 (ref 5.0–8.0)

## 2020-12-12 MED ORDER — METRONIDAZOLE 500 MG PO TABS
2000.0000 mg | ORAL_TABLET | Freq: Once | ORAL | Status: AC
  Administered 2020-12-12: 2000 mg via ORAL
  Filled 2020-12-12: qty 4

## 2020-12-12 NOTE — Discharge Instructions (Addendum)
  You were treated for possible trichomonas infection.  Follow-up with the doctor of your choice, as needed for problems.  We will contact you if you need additional treatment.

## 2020-12-12 NOTE — ED Triage Notes (Signed)
Pt c/o testicle pain that started Sunday. Denies injury.

## 2020-12-12 NOTE — ED Provider Notes (Signed)
[facility] EMERGENCY DEPARTMENT Provider Note   CSN: [id number] Arrival date & time: 12/12/20  1607     History Chief Complaint  Patient presents with  . Testicle Pain    Nathaniel Holt is a 30 y.o. male.  HPI He presents for evaluation of sore testicles for several days.  He occasionally has burning with urination but no urinary frequency or hematuria.  His girlfriend told him that she tested positive for trichomonas, several days ago.  They have been sexually active together.  He denies fever, chills, nausea, vomiting, abdominal or back pain.  No problems with bowel movements.  There are no other known modifying factors.    Past Medical History:  Diagnosis Date  . Bipolar disorder (HCC)   . Rhabdomyolysis   . Schizophrenia [facility])     Patient Active Problem List   Diagnosis Date Noted  . Rhabdomyolysis 02/10/2020    Past Surgical History:  Procedure Laterality Date  . LEG SURGERY         History reviewed. No pertinent family history.  Social History   Tobacco Use  . Smoking status: Current Every Day Smoker    Packs/day: 1.00    Types: Cigarettes  . Smokeless tobacco: Never Used  Vaping Use  . Vaping Use: Never used  Substance Use Topics  . Alcohol use: No  . Drug use: Yes    Types: Marijuana    Home Medications Prior to Admission medications   Medication Sig Start Date End Date Taking? Authorizing Provider  doxycycline (VIBRAMYCIN) 100 MG capsule Take 1 capsule (100 mg total) by mouth 2 (two) times daily. 06/29/20   Elson Areas, PA-C    Allergies    Patient has no known allergies.  Review of Systems   Review of Systems  All other systems reviewed and are negative.   Physical Exam Updated Vital Signs BP 137/86   Pulse 83   Temp 98.2 F (36.8 C) (Oral)   Resp 18   Ht 5\' 9"  (1.753 m)   Wt 72.6 kg   SpO2 100%   BMI 23.63 kg/m   Physical Exam Vitals and nursing note reviewed.  Constitutional:      General: He is not in acute  distress.    Appearance: He is well-developed. He is not ill-appearing, toxic-appearing or diaphoretic.  HENT:     Head: Normocephalic and atraumatic.     Right Ear: External ear normal.     Left Ear: External ear normal.  Eyes:     Conjunctiva/sclera: Conjunctivae normal.     Pupils: Pupils are equal, round, and reactive to light.  Neck:     Trachea: Phonation normal.  Cardiovascular:     Rate and Rhythm: Normal rate.  Pulmonary:     Effort: Pulmonary effort is normal.  Abdominal:     General: There is no distension.  Genitourinary:    Comments: Normal appearance of penis and testicles.  Testicles are nontender to palpation.  No scrotal or testicular mass.  Minimal amount of clear drainage from the distal urethra.  No deformity or adenopathy of the groin. Musculoskeletal:        General: Normal range of motion.     Cervical back: Normal range of motion and neck supple.  Skin:    General: Skin is warm and dry.  Neurological:     Mental Status: He is alert and oriented to person, place, and time.     Cranial Nerves: No cranial nerve deficit.  Sensory: No sensory deficit.     Motor: No abnormal muscle tone.     Coordination: Coordination normal.  Psychiatric:        Mood and Affect: Mood normal.        Behavior: Behavior normal.        Thought Content: Thought content normal.        Judgment: Judgment normal.     ED Results / Procedures / Treatments   Labs (all labs ordered are listed, but only abnormal results are displayed) Labs Reviewed  URINALYSIS, ROUTINE W REFLEX MICROSCOPIC  GC/CHLAMYDIA PROBE AMP ([place]) NOT AT [facility]    EKG None  Radiology No results found.  Procedures Procedures   Medications Ordered in ED Medications  metroNIDAZOLE (FLAGYL) tablet 2,000 mg (has no administration in time range)    ED Course  I have reviewed the triage vital signs and the nursing notes.  Pertinent labs & imaging results that were available during my care of  the patient were reviewed by me and considered in my medical decision making (see chart for details).    MDM Rules/Calculators/A&P                           Patient Vitals for the past 24 hrs:  BP Temp Temp src Pulse Resp SpO2 Height Weight  12/12/20 1617 137/86 98.2 F (36.8 C) Oral 83 18 100 % -- --  12/12/20 1617 -- -- -- -- -- -- 5\' 9"  (1.753 m) 72.6 kg    5:48 PM Reevaluation with update and discussion. After initial assessment and treatment, an updated evaluation reveals he is comfortable now has no further complaints.  Findings discussed and questions answered.   Medical Decision Making:  This patient is presenting for evaluation of scrotal pain, which does require a range of treatment options, and is a complaint that involves a moderate risk of morbidity and mortality. The differential diagnoses include epididymitis, urethritis, trichomonas infection. I decided to review old records, and in summary middle-aged male presenting for evaluation of scrotal pain with sexual partner having trichomonas.  I did not additional historical information from anyone.  Clinical Laboratory Tests Ordered, included Urinalysis and Urethral swab for GC and chlamydia. Review indicates urinalysis normal, GC chlamydia pending.  Critical Interventions-clinical evaluation, swab from distal urethra by me, requested urinalysis,  After These Interventions, the Patient was reevaluated and was found stable for discharge.  Treated with Flagyl prior to discharge records) positive for trichomonas.  Doubt urethritis.  Doubt epididymitis or testicular torsion.  CRITICAL CARE-no Performed by: Mancel Bale  Nursing Notes Reviewed/ Care Coordinated Applicable Imaging Reviewed Interpretation of Laboratory Data incorporated into ED treatment  The patient appears reasonably screened and/or stabilized for discharge and I doubt any other medical condition or other [facility] requiring further screening,  evaluation, or treatment in the ED at this time prior to discharge.  Plan: Home Medications-OTC as needed; Home Treatments-rest, fluids; return here if the recommended treatment, does not improve the symptoms; Recommended follow up-PCP, as needed     Final Clinical Impression(s) / ED Diagnoses Final diagnoses:  None    Rx / DC Orders ED Discharge Orders    None       [facility], MD 12/12/20 [phone]

## 2020-12-14 LAB — GC/CHLAMYDIA PROBE AMP (~~LOC~~) NOT AT ARMC
Chlamydia: NEGATIVE
Comment: NEGATIVE
Comment: NORMAL
Neisseria Gonorrhea: NEGATIVE

## 2021-02-05 ENCOUNTER — Other Ambulatory Visit: Payer: Self-pay

## 2021-02-05 ENCOUNTER — Emergency Department (HOSPITAL_COMMUNITY)
Admission: EM | Admit: 2021-02-05 | Discharge: 2021-02-05 | Disposition: A | Discharge status: Home / Self Care | Payer: Self-pay | Attending: Emergency Medicine | Admitting: Emergency Medicine

## 2021-02-05 DIAGNOSIS — Z202 Contact with and (suspected) exposure to infections with a predominantly sexual mode of transmission: Secondary | ICD-10-CM | POA: Insufficient documentation

## 2021-02-05 DIAGNOSIS — F17210 Nicotine dependence, cigarettes, uncomplicated: Secondary | ICD-10-CM | POA: Insufficient documentation

## 2021-02-05 DIAGNOSIS — R300 Dysuria: Secondary | ICD-10-CM | POA: Insufficient documentation

## 2021-02-05 LAB — URINALYSIS, ROUTINE W REFLEX MICROSCOPIC
Bilirubin Urine: NEGATIVE
Glucose, UA: NEGATIVE mg/dL
Hgb urine dipstick: NEGATIVE
Ketones, ur: NEGATIVE mg/dL
Leukocytes,Ua: NEGATIVE
Nitrite: NEGATIVE
Protein, ur: NEGATIVE mg/dL
Specific Gravity, Urine: 1.023 (ref 1.005–1.030)
pH: 7 (ref 5.0–8.0)

## 2021-02-05 MED ORDER — PHENAZOPYRIDINE HCL 200 MG PO TABS: 200.0000 mg | ORAL_TABLET | Freq: Three times a day (TID) | ORAL | 0 refills | Status: DC

## 2021-02-05 NOTE — Discharge Instructions (Signed)
You were seen in the emergency room today with burning with urination.  I have called in medicines to help with your discomfort.  Your urine did not show sign of infection.  I have other tests which are pending and you will get an alert when those results are back on your phone through the MyChart app.

## 2021-02-05 NOTE — ED Provider Notes (Signed)
Emergency Department Provider Note   I have reviewed the triage vital signs and the nursing notes.   HISTORY  Chief Complaint SEXUALLY TRANSMITTED DISEASE   HPI Nathaniel Holt is a 30 y.o. male with past medical history reviewed below presents to the emergency department with discomfort at the end of urination.  Patient states that he was treated approximately 1 month ago with single dose of Flagyl for trichomonas.  He is concerned that he may have been reinfected.  He is sexually active with his wife who also developed the infection.  He denies any urethral discharge.  No sexual dysfunction.  No pain into the abdomen or back.  No fevers or chills.  Past Medical History:  Diagnosis Date  . Bipolar disorder (HCC)   . Rhabdomyolysis   . Schizophrenia [facility])     Patient Active Problem List   Diagnosis Date Noted  . Rhabdomyolysis 02/10/2020    Past Surgical History:  Procedure Laterality Date  . LEG SURGERY      Allergies Patient has no known allergies.  No family history on file.  Social History Social History   Tobacco Use  . Smoking status: Current Every Day Smoker    Packs/day: 1.00    Types: Cigarettes  . Smokeless tobacco: Never Used  Vaping Use  . Vaping Use: Never used  Substance Use Topics  . Alcohol use: No  . Drug use: Yes    Types: Marijuana    Review of Systems  Constitutional: No fever/chills Cardiovascular: Denies chest pain. Respiratory: Denies shortness of breath. Gastrointestinal: No abdominal pain.   Genitourinary: Discomfort at the end of urination.  Musculoskeletal: Negative for back pain. Skin: Negative for rash. Neurological: Negative for headaches.  ____________________________________________   PHYSICAL EXAM:  VITAL SIGNS: ED Triage Vitals  Enc Vitals Group     BP 02/05/21 0744 134/88     Pulse Rate 02/05/21 0744 76     Resp 02/05/21 0744 18     Temp 02/05/21 0744 (!) 97.1 F (36.2 C)     Temp Source 02/05/21 0744  Temporal     SpO2 02/05/21 0744 98 %     Weight 02/05/21 0744 165 lb (74.8 kg)     Height 02/05/21 0744 5\' 9"  (1.753 m)   Constitutional: Alert and oriented. Well appearing and in no acute distress. Eyes: Conjunctivae are normal.  Head: Atraumatic. Nose: No congestion/rhinnorhea. Mouth/Throat: Mucous membranes are moist.   Neck: No stridor Cardiovascular: Good peripheral circulation.  Respiratory: Normal respiratory effort.  Gastrointestinal: Soft and nontender. No distention.  Musculoskeletal: No gross deformities of extremities. Neurologic:  Normal speech and language.  Skin:  Skin is warm, dry and intact. No rash noted.  ____________________________________________   LABS (all labs ordered are listed, but only abnormal results are displayed)  Labs Reviewed  URINE CULTURE  URINALYSIS, ROUTINE W REFLEX MICROSCOPIC  GC/CHLAMYDIA PROBE AMP ([place]) NOT AT [facility]   ____________________________________________   PROCEDURES  Procedure(s) performed:   Procedures  None  ____________________________________________   INITIAL IMPRESSION / ASSESSMENT AND PLAN / ED COURSE  Pertinent labs & imaging results that were available during my care of the patient were reviewed by me and considered in my medical decision making (see chart for details).   Patient is concerned with reinfection of trichomonas.  He is having some discomfort at the end of urination but no discharge.  Abdomen is diffusely soft and nontender.  Doubt associated intra-abdominal process such as kidney stone, appendicitis, cholecystitis.  Plan for UA along with urine GC/chlamydia.   Urinalysis here without sign of infection.  No trichomonas mentioned on UA.  Will send urine gonorrhea/chlamydia.  Plan for Pyridium for symptoms and PCP follow-up.  Patient has the MyChart app and will follow the test results and return for treatment if needed.  ____________________________________________  FINAL CLINICAL  IMPRESSION(S) / ED DIAGNOSES  Final diagnoses:  Dysuria    NEW OUTPATIENT MEDICATIONS STARTED DURING THIS VISIT:  New Prescriptions   PHENAZOPYRIDINE (PYRIDIUM) 200 MG TABLET    Take 1 tablet (200 mg total) by mouth 3 (three) times daily with meals.    Note:  This document was prepared using Dragon voice recognition software and may include unintentional dictation errors.  Alona Bene, MD, [facility] Emergency Medicine    Verlisa Vara, Arlyss Repress, MD 02/05/21 0930

## 2021-02-05 NOTE — ED Triage Notes (Signed)
Pt recently treated for trich approx 1 month ago. Pt states he started having a burning sensation come back approx 3 days ago, needs to be rechecked.

## 2021-02-05 NOTE — ED Notes (Signed)
Gown and sheet provided and instructed pt to undress waist down; verbalized understanding; pt getting urine sample at this time

## 2021-02-06 LAB — GC/CHLAMYDIA PROBE AMP (~~LOC~~) NOT AT ARMC
Chlamydia: NEGATIVE
Comment: NEGATIVE
Comment: NORMAL
Neisseria Gonorrhea: NEGATIVE

## 2021-02-06 LAB — URINE CULTURE: Culture: NO GROWTH

## 2021-04-18 ENCOUNTER — Other Ambulatory Visit: Payer: Self-pay

## 2021-04-18 ENCOUNTER — Emergency Department (HOSPITAL_COMMUNITY)
Admission: EM | Admit: 2021-04-18 | Discharge: 2021-04-18 | Disposition: A | Discharge status: Home / Self Care | Payer: Self-pay | Attending: Emergency Medicine | Admitting: Emergency Medicine

## 2021-04-18 ENCOUNTER — Encounter (HOSPITAL_COMMUNITY): Payer: Self-pay | Admitting: Emergency Medicine

## 2021-04-18 DIAGNOSIS — U071 COVID-19: Secondary | ICD-10-CM | POA: Insufficient documentation

## 2021-04-18 DIAGNOSIS — Z28310 Unvaccinated for covid-19: Secondary | ICD-10-CM | POA: Insufficient documentation

## 2021-04-18 DIAGNOSIS — F17210 Nicotine dependence, cigarettes, uncomplicated: Secondary | ICD-10-CM | POA: Insufficient documentation

## 2021-04-18 LAB — RESP PANEL BY RT-PCR (FLU A&B, COVID) ARPGX2
Influenza A by PCR: NEGATIVE
Influenza B by PCR: NEGATIVE
SARS Coronavirus 2 by RT PCR: POSITIVE — AB

## 2021-04-18 MED ORDER — ONDANSETRON HCL 4 MG PO TABS: 4.0000 mg | ORAL_TABLET | Freq: Four times a day (QID) | ORAL | 0 refills | Status: DC

## 2021-04-18 MED ORDER — IBUPROFEN 800 MG PO TABS
800.0000 mg | ORAL_TABLET | Freq: Once | ORAL | Status: AC
  Administered 2021-04-18: 800 mg via ORAL
  Filled 2021-04-18: qty 1

## 2021-04-18 MED ORDER — BENZONATATE 200 MG PO CAPS: 200.0000 mg | ORAL_CAPSULE | Freq: Three times a day (TID) | ORAL | 0 refills | Status: DC

## 2021-04-18 MED ORDER — ONDANSETRON 4 MG PO TBDP
4.0000 mg | ORAL_TABLET | Freq: Once | ORAL | Status: AC
  Administered 2021-04-18: 4 mg via ORAL
  Filled 2021-04-18: qty 1

## 2021-04-18 NOTE — ED Provider Notes (Signed)
[facility] EMERGENCY DEPARTMENT Provider Note   CSN: [id number] Arrival date & time: 04/18/21  1531     History Chief Complaint  Patient presents with   Emesis    Nathaniel Holt is a 30 y.o. male.   Emesis Associated symptoms: chills, headaches and myalgias (Generalized body aches)   Associated symptoms: no abdominal pain, no arthralgias, no cough, no fever and no sore throat        Nathaniel Holt is a 30 y.o. male who presents to the Emergency Department complaining of generalized body aches, chills, frontal headache nausea and vomiting.  Symptoms began last evening, worse this morning.  He had several episodes of vomiting this morning, but none since.  Also describes gradually worsening frontal headache.  States headache is throbbing in quality.  Sensitive to bright lights as well.  Other family members have similar illness.  Patient is concerned he may have COVID.  States he is unvaccinated.  He denies any significant cough, chest pain, shortness of breath, fever or abdominal pain.  No diarrhea.    Past Medical History:  Diagnosis Date   Bipolar disorder (HCC)    Rhabdomyolysis    Schizophrenia [facility])     Patient Active Problem List   Diagnosis Date Noted   Rhabdomyolysis 02/10/2020    Past Surgical History:  Procedure Laterality Date   LEG SURGERY         History reviewed. No pertinent family history.  Social History   Tobacco Use   Smoking status: Every Day    Packs/day: 1.00    Types: Cigarettes   Smokeless tobacco: Never  Vaping Use   Vaping Use: Never used  Substance Use Topics   Alcohol use: No   Drug use: Yes    Frequency: 3.0 times per week    Types: Marijuana    Home Medications Prior to Admission medications   Medication Sig Start Date End Date Taking? Authorizing Provider  doxycycline (VIBRAMYCIN) 100 MG capsule Take 1 capsule (100 mg total) by mouth 2 (two) times daily. 06/29/20   Elson Areas, PA-C  phenazopyridine (PYRIDIUM)  200 MG tablet Take 1 tablet (200 mg total) by mouth 3 (three) times daily with meals. 02/05/21   Long, Arlyss Repress, MD    Allergies    Patient has no known allergies.  Review of Systems   Review of Systems  Constitutional:  Positive for chills and fatigue. Negative for fever.  HENT:  Negative for sore throat and trouble swallowing.   Respiratory:  Negative for cough, shortness of breath and wheezing.   Cardiovascular:  Negative for chest pain and palpitations.  Gastrointestinal:  Positive for nausea and vomiting. Negative for abdominal pain and blood in stool.  Genitourinary:  Negative for dysuria, flank pain and hematuria.  Musculoskeletal:  Positive for myalgias (Generalized body aches). Negative for arthralgias, back pain, neck pain and neck stiffness.  Skin:  Negative for rash.  Neurological:  Positive for headaches. Negative for dizziness, weakness and numbness.  Hematological:  Does not bruise/bleed easily.   Physical Exam Updated Vital Signs BP 118/74   Pulse 93   Temp 98.4 F (36.9 C) (Oral)   Resp 17   Ht 5\' 9"  (1.753 m)   Wt 72.6 kg   SpO2 98%   BMI 23.63 kg/m   Physical Exam Vitals and nursing note reviewed.  Constitutional:      General: He is not in acute distress.    Appearance: He is not toxic-appearing.  Cardiovascular:  Rate and Rhythm: Normal rate and regular rhythm.     Pulses: Normal pulses.  Pulmonary:     Effort: Pulmonary effort is normal. No respiratory distress.     Breath sounds: Normal breath sounds.  Abdominal:     General: There is no distension.     Palpations: Abdomen is soft.     Tenderness: There is no abdominal tenderness. There is no guarding.  Musculoskeletal:        General: Normal range of motion.  Skin:    General: Skin is warm.     Capillary Refill: Capillary refill takes less than 2 seconds.  Neurological:     General: No focal deficit present.     Mental Status: He is alert.     Sensory: No sensory deficit.     Motor: No  weakness.    ED Results / Procedures / Treatments   Labs (all labs ordered are listed, but only abnormal results are displayed) Labs Reviewed  RESP PANEL BY RT-PCR (FLU A&B, COVID) ARPGX2 - Abnormal; Notable for the following components:      Result Value   SARS Coronavirus 2 by RT PCR POSITIVE (*)    All other components within normal limits    EKG None  Radiology No results found.  Procedures Procedures   Medications Ordered in ED Medications - No data to display  ED Course  I have reviewed the triage vital signs and the nursing notes.  Pertinent labs & imaging results that were available during my care of the patient were reviewed by me and considered in my medical decision making (see chart for details).    MDM Rules/Calculators/A&P                           Patient here with symptoms of generalized body aches, chills, headache, nausea and vomiting.  Symptoms x24 hours.  Household members sick with similar symptoms.    Patient is COVID-positive.  He has not been vaccinated.  Patient is healthy male without comorbidities or risk factors to indicate need for antiviral COVID treatment. He is ambulated to the restroom without difficulty.  No hypoxia with ambulation.  He has tolerated oral fluids without vomiting.  Vital signs reassuring.  No hypoxia.  Lungs are clear to auscultation.  Appears appropriate for discharge home.  He agrees to isolation instructions as discussed.  Return precautions were also given.  All questions were answered.   Final Clinical Impression(s) / ED Diagnoses Final diagnoses:  COVID    Rx / DC Orders ED Discharge Orders     None        Rosey Bath 04/18/21 2028    Jacalyn Lefevre, MD 04/18/21 2139

## 2021-04-18 NOTE — ED Triage Notes (Signed)
Pt states he began to have chills last night and vomited one time this morning.  Pt states he has a HA.

## 2021-04-18 NOTE — Discharge Instructions (Addendum)
Your test today was positive for COVID.  You will need to isolate at home and wear a mask when around other household members.  I recommend that you take Tylenol and/or ibuprofen every 4-6 hours if needed for body aches and/or fever.  Small frequent sips of fluids.

## 2021-04-20 ENCOUNTER — Emergency Department (HOSPITAL_COMMUNITY)
Admission: EM | Admit: 2021-04-20 | Discharge: 2021-04-20 | Disposition: A | Discharge status: Home / Self Care | Payer: Self-pay | Attending: Emergency Medicine | Admitting: Emergency Medicine

## 2021-04-20 ENCOUNTER — Other Ambulatory Visit: Payer: Self-pay

## 2021-04-20 ENCOUNTER — Emergency Department (HOSPITAL_COMMUNITY): Payer: Self-pay

## 2021-04-20 ENCOUNTER — Encounter (HOSPITAL_COMMUNITY): Payer: Self-pay

## 2021-04-20 DIAGNOSIS — F17210 Nicotine dependence, cigarettes, uncomplicated: Secondary | ICD-10-CM | POA: Insufficient documentation

## 2021-04-20 DIAGNOSIS — R519 Headache, unspecified: Secondary | ICD-10-CM

## 2021-04-20 DIAGNOSIS — U071 COVID-19: Secondary | ICD-10-CM | POA: Insufficient documentation

## 2021-04-20 MED ORDER — DEXAMETHASONE 4 MG PO TABS
10.0000 mg | ORAL_TABLET | Freq: Once | ORAL | Status: AC
  Administered 2021-04-20: 10 mg via ORAL
  Filled 2021-04-20: qty 3

## 2021-04-20 MED ORDER — LACTATED RINGERS IV BOLUS
1000.0000 mL | Freq: Once | INTRAVENOUS | Status: AC
  Administered 2021-04-20: 1000 mL via INTRAVENOUS

## 2021-04-20 MED ORDER — PROCHLORPERAZINE EDISYLATE 10 MG/2ML IJ SOLN
10.0000 mg | Freq: Once | INTRAMUSCULAR | Status: AC
  Administered 2021-04-20: 10 mg via INTRAVENOUS
  Filled 2021-04-20: qty 2

## 2021-04-20 MED ORDER — DIPHENHYDRAMINE HCL 50 MG/ML IJ SOLN
25.0000 mg | Freq: Once | INTRAMUSCULAR | Status: AC
  Administered 2021-04-20: 25 mg via INTRAVENOUS
  Filled 2021-04-20: qty 1

## 2021-04-20 NOTE — ED Provider Notes (Signed)
[facility] EMERGENCY DEPARTMENT Provider Note   CSN: [id number] Arrival date & time: 04/20/21  0117     History Chief Complaint  Patient presents with   Headache    Covid+     Nathaniel Holt is a 30 y.o. male.  The history is provided by the patient.  Headache He has history of schizophrenia and was diagnosed with COVID-19 yesterday.  He comes in complaining of a headache for the last 2 days.  Headache is primarily left temporal but sometimes moves to the right temporal area.  There is a throbbing pain he rates it at 10/10.  Nothing makes it better, nothing makes it worse.  He denies any visual change, nausea, vomiting.  He denies photophobia and phonophobia.  He denies weakness, numbness, tingling.  Pain is rated at 10/10.  He tried taking ibuprofen and acetaminophen without relief.   Past Medical History:  Diagnosis Date   Bipolar disorder (HCC)    Rhabdomyolysis    Schizophrenia [facility])     Patient Active Problem List   Diagnosis Date Noted   Rhabdomyolysis 02/10/2020    Past Surgical History:  Procedure Laterality Date   LEG SURGERY         No family history on file.  Social History   Tobacco Use   Smoking status: Every Day    Packs/day: 1.00    Types: Cigarettes   Smokeless tobacco: Never  Vaping Use   Vaping Use: Never used  Substance Use Topics   Alcohol use: No   Drug use: Yes    Frequency: 3.0 times per week    Types: Marijuana    Home Medications Prior to Admission medications   Medication Sig Start Date End Date Taking? Authorizing Provider  benzonatate (TESSALON) 200 MG capsule Take 1 capsule (200 mg total) by mouth every 8 (eight) hours. Swallow whole, do not chew 04/18/21   Triplett, Tammy, PA-C  doxycycline (VIBRAMYCIN) 100 MG capsule Take 1 capsule (100 mg total) by mouth 2 (two) times daily. Patient not taking: Reported on 04/18/2021 06/29/20   Elson Areas, PA-C  ondansetron (ZOFRAN) 4 MG tablet Take 1 tablet (4 mg total) by mouth  every 6 (six) hours. As needed for nausea vomiting 04/18/21   Triplett, Tammy, PA-C  phenazopyridine (PYRIDIUM) 200 MG tablet Take 1 tablet (200 mg total) by mouth 3 (three) times daily with meals. Patient not taking: Reported on 04/18/2021 02/05/21   Long, Arlyss Repress, MD    Allergies    Patient has no known allergies.  Review of Systems   Review of Systems  Neurological:  Positive for headaches.  All other systems reviewed and are negative.  Physical Exam Updated Vital Signs BP 121/89   Pulse 73   Temp 98.5 F (36.9 C)   Resp 18   Ht 5\' 9"  (1.753 m)   Wt 72.6 kg   SpO2 100%   BMI 23.64 kg/m   Physical Exam Vitals and nursing note reviewed.  30 year old male, resting comfortably and in no acute distress. Vital signs are normal. Oxygen saturation is 100%, which is normal. Head is normocephalic and atraumatic. PERRLA, EOMI. Oropharynx is clear.  There is no tenderness to palpation over the temporalis muscles or the insertion of the paracervical muscles. Neck is nontender and supple without adenopathy or JVD. Back is nontender and there is no CVA tenderness. Lungs are clear without rales, wheezes, or rhonchi. Chest is nontender. Heart has regular rate and rhythm without murmur. Abdomen  is soft, flat, nontender without masses or hepatosplenomegaly and peristalsis is normoactive. Extremities have no cyanosis or edema, full range of motion is present. Skin is warm and dry without rash. Neurologic: Mental status is normal, cranial nerves are intact, there are no motor or sensory deficits.  ED Results / Procedures / Treatments    Radiology CT HEAD WO CONTRAST ( )  Result Date: 04/20/2021 CLINICAL DATA:  Headache, COVID positive EXAM: CT HEAD WITHOUT CONTRAST TECHNIQUE: Contiguous axial images were obtained from the base of the skull through the vertex without intravenous contrast. COMPARISON:  06/18/2009 FINDINGS: Brain: No evidence of acute infarction, hemorrhage, hydrocephalus,  extra-axial collection or mass lesion/mass effect. Vascular: No hyperdense vessel or unexpected calcification. Skull: Normal. Negative for fracture or focal lesion. Sinuses/Orbits: The visualized paranasal sinuses are essentially clear. The mastoid air cells are unopacified. Other: None. IMPRESSION: Normal head CT. Electronically Signed   By: Charline Bills M.D.   On: 04/20/2021 03:00    Procedures Procedures   Medications Ordered in ED Medications  dexamethasone (DECADRON) tablet 10 mg (has no administration in time range)  lactated ringers bolus 1,000 mL (1,000 mLs Intravenous New Bag/Given 04/20/21 0253)  prochlorperazine (COMPAZINE) injection 10 mg (10 mg Intravenous Given 04/20/21 0249)  diphenhydrAMINE (BENADRYL) injection 25 mg (25 mg Intravenous Given 04/20/21 0250)    ED Course  I have reviewed the triage vital signs and the nursing notes.  Pertinent labs & imaging results that were available during my care of the patient were reviewed by me and considered in my medical decision making (see chart for details).   MDM Rules/Calculators/A&P                         Headache which is nonspecific in character.  No red flags to suggest serious pathology.  However, in setting of COVID-19, will send for CT of head and he will be given a headache cocktail.  Old records reviewed confirming ED visit yesterday with positive COVID-19 test.  CT head is negative for any acute process.  He got excellent relief of headache from above-noted treatment.  He is given a dose of dexamethasone and discharged.  Final Clinical Impression(s) / ED Diagnoses Final diagnoses:  Bad headache  COVID-19 virus infection    Rx / DC Orders ED Discharge Orders     None        Dione Booze, MD 04/20/21 [phone]

## 2021-04-20 NOTE — ED Triage Notes (Signed)
Pt was seen here on 8/10 - covid + Pt has headache

## 2022-02-16 IMAGING — CT CT HEAD W/O CM
4 series · 16 of 47 positions shown, 18 images · non-contrast
Comparison: 06/18/2009

CLINICAL DATA: Headache, COVID positive

EXAM:
CT HEAD WITHOUT CONTRAST
TECHNIQUE: Contiguous axial images were obtained from the base of the skull
through the vertex without intravenous contrast.

[Series 3: head w o · axial · 0.41mm/px · z∈[+33,+143]mm · 7 of 30 slices shown, 9 images]
[im 4/30  brain]
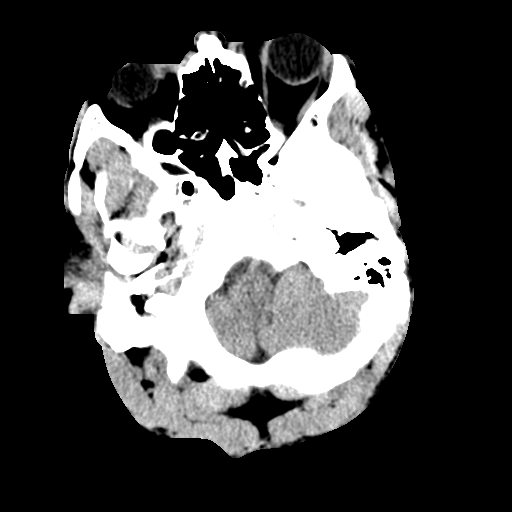
[im 4/30  bone]
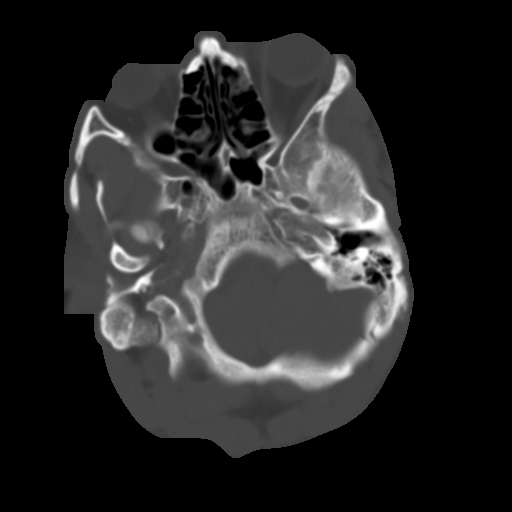
[im 8/30  brain]
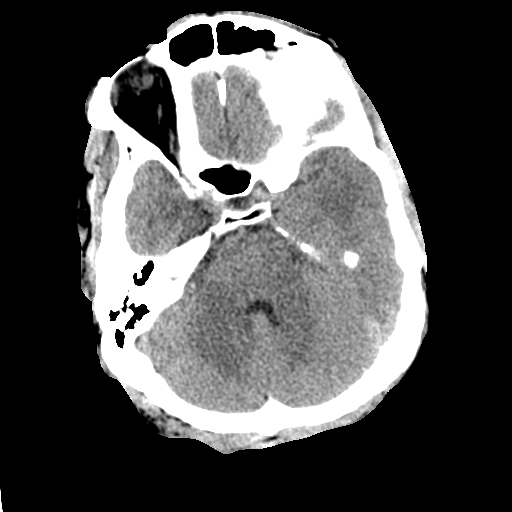
[im 11/30  brain]
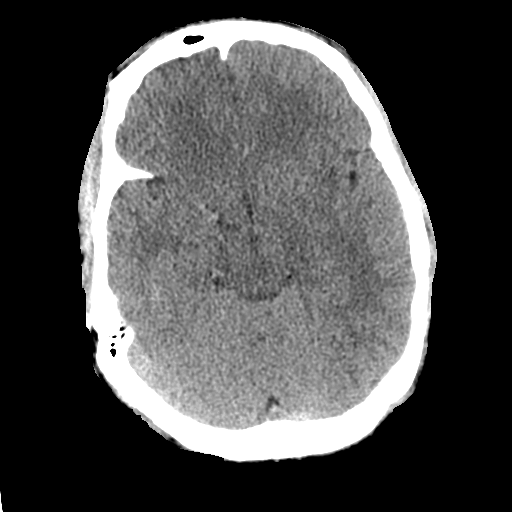
[im 15/30  brain]
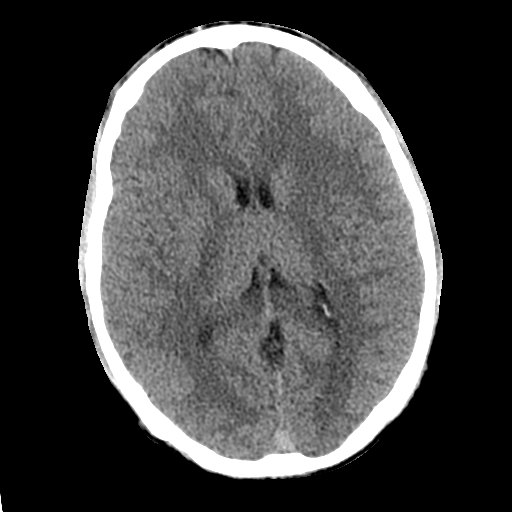
[im 19/30  brain]
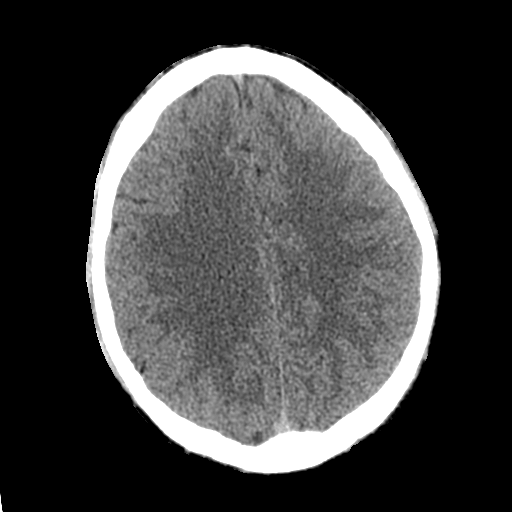
[im 19/30  bone]
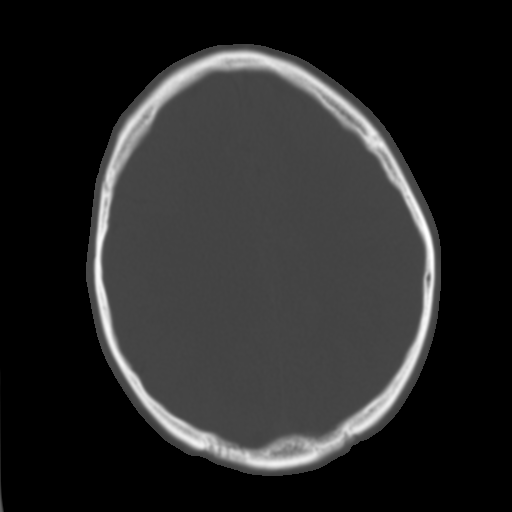
[im 22/30  brain]
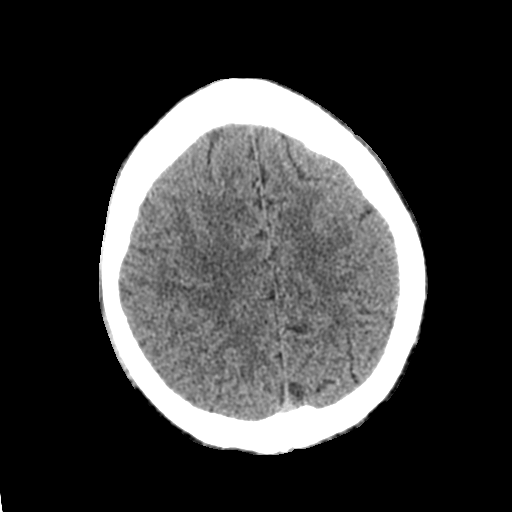
[im 26/30  brain]
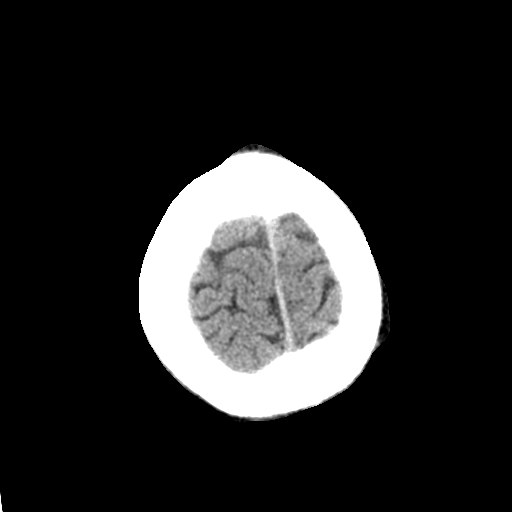

[Series 4: head bone · axial · 0.41mm/px · z∈[+32,+62]mm · 3 of 75 slices shown]
[im 8/75  bone]
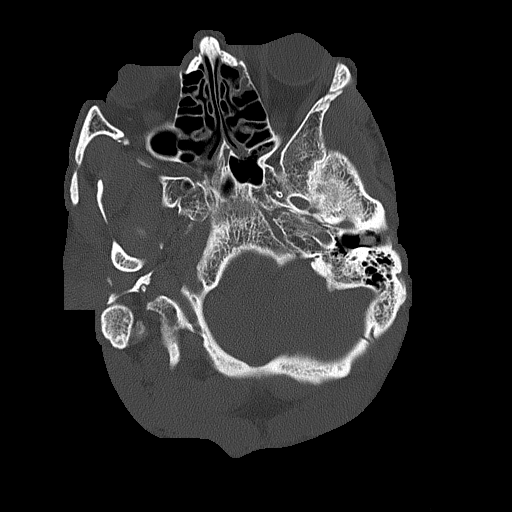
[im 15/75  bone]
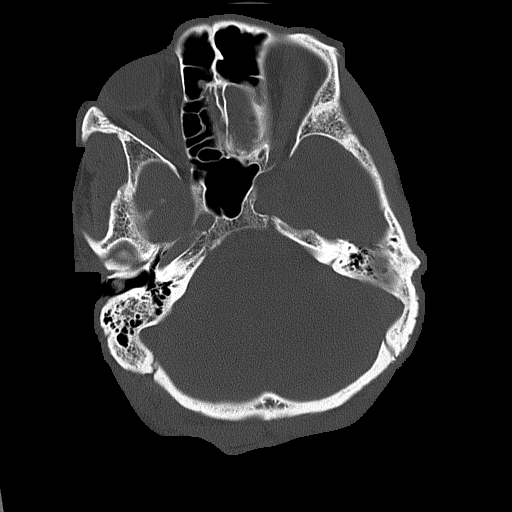
[im 23/75  bone]
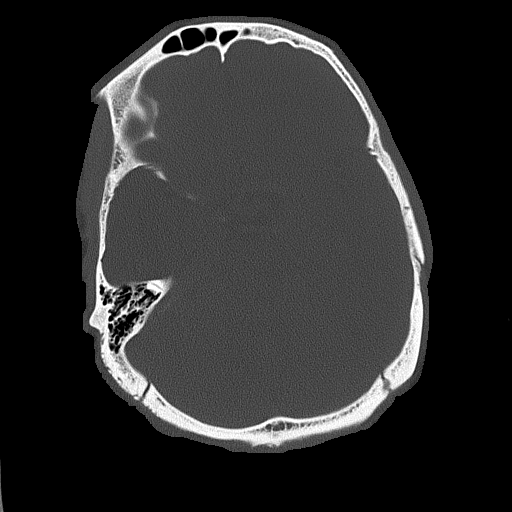

[Series 5: coronal soft · coronal · 0.30mm/px · 3 of 71 slices shown]
[im 24/71  brain]
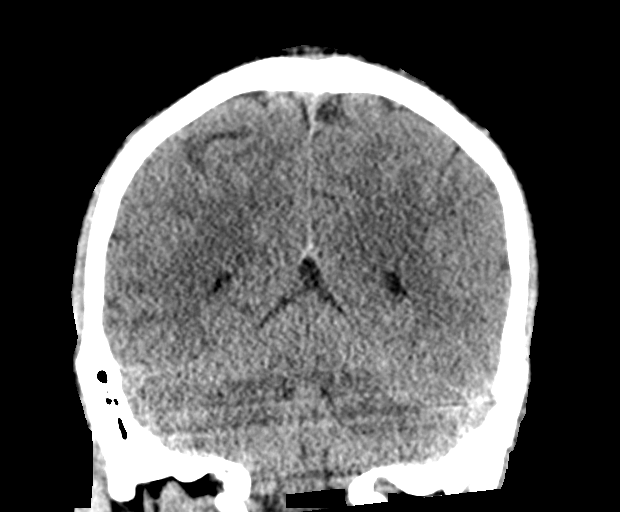
[im 32/71  brain]
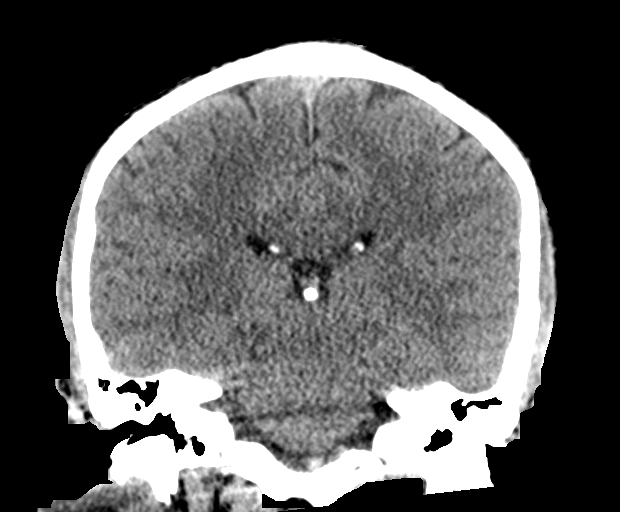
[im 39/71  brain]
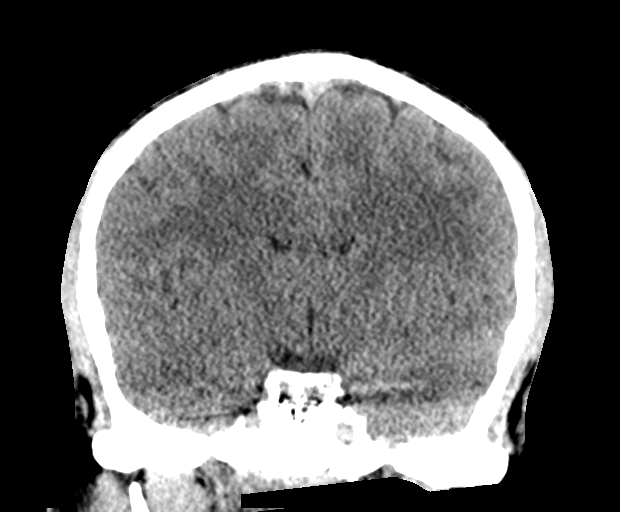

[Series 6: sagittal soft · sagittal · 0.30mm/px · 3 of 57 slices shown]
[im 19/57  brain]
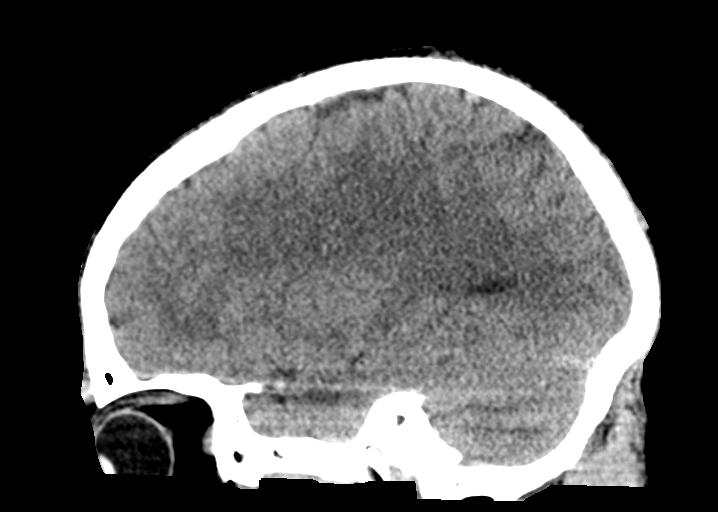
[im 29/57  brain]
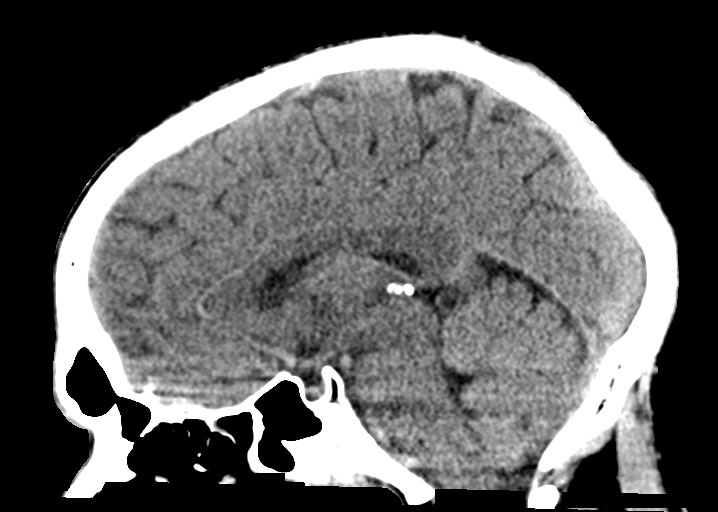
[im 38/57  brain]
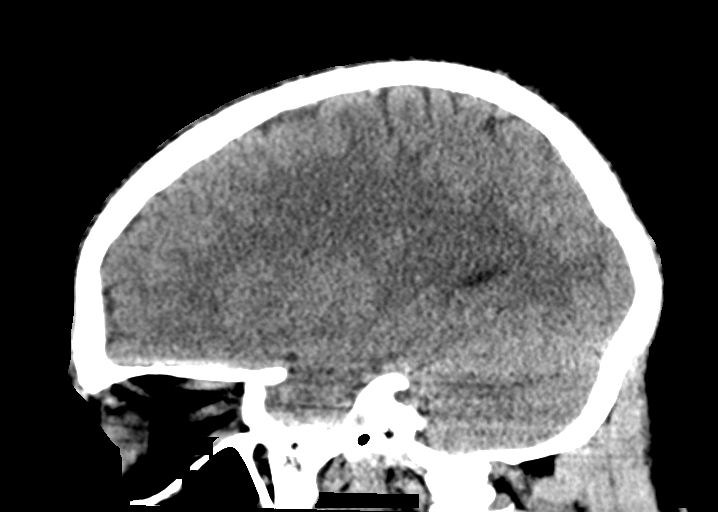

[16 of 47 positions shown; findings below may reference images not displayed]

FINDINGS: Brain: No evidence of acute infarction, hemorrhage, hydrocephalus,
extra-axial collection or mass lesion/mass effect.

Vascular: No hyperdense vessel or unexpected calcification.

Skull: Normal. Negative for fracture or focal lesion.

Sinuses/Orbits: The visualized paranasal sinuses are essentially
clear. The mastoid air cells are unopacified.

Other: None.
IMPRESSION: Normal head CT.

## 2022-11-15 ENCOUNTER — Emergency Department (HOSPITAL_COMMUNITY)
Admission: EM | Admit: 2022-11-15 | Discharge: 2022-11-15 | Disposition: A | Discharge status: Home / Self Care | Payer: Self-pay | Attending: Emergency Medicine | Admitting: Emergency Medicine

## 2022-11-15 ENCOUNTER — Other Ambulatory Visit: Payer: Self-pay

## 2022-11-15 ENCOUNTER — Emergency Department (HOSPITAL_COMMUNITY): Payer: Self-pay

## 2022-11-15 ENCOUNTER — Encounter (HOSPITAL_COMMUNITY): Payer: Self-pay | Admitting: Emergency Medicine

## 2022-11-15 DIAGNOSIS — R0789 Other chest pain: Secondary | ICD-10-CM | POA: Insufficient documentation

## 2022-11-15 DIAGNOSIS — M62830 Muscle spasm of back: Secondary | ICD-10-CM | POA: Insufficient documentation

## 2022-11-15 LAB — BASIC METABOLIC PANEL
Anion gap: 7 (ref 5–15)
BUN: 9 mg/dL (ref 6–20)
CO2: 24 mmol/L (ref 22–32)
Calcium: 8.2 mg/dL — ABNORMAL LOW (ref 8.9–10.3)
Chloride: 104 mmol/L (ref 98–111)
Creatinine, Ser: 0.8 mg/dL (ref 0.61–1.24)
GFR, Estimated: >60 mL/min (ref >60)
Glucose, Bld: 99 mg/dL (ref 70–99)
Potassium: 3.9 mmol/L (ref 3.5–5.1)
Sodium: 135 mmol/L (ref 135–145)

## 2022-11-15 LAB — CBC
HCT: 40 % (ref 39.0–52.0)
Hemoglobin: 13.7 g/dL (ref 13.0–17.0)
MCH: 32.1 pg (ref 26.0–34.0)
MCHC: 34.3 g/dL (ref 30.0–36.0)
MCV: 93.7 fL (ref 80.0–100.0)
Platelets: 267 10*3/uL (ref 150–400)
RBC: 4.27 MIL/uL (ref 4.22–5.81)
RDW: 13.2 % (ref 11.5–15.5)
WBC: 4.4 10*3/uL (ref 4.0–10.5)
nRBC: 0 % (ref 0.0–0.2)

## 2022-11-15 LAB — TROPONIN I (HIGH SENSITIVITY): Troponin I (High Sensitivity): 3 ng/L (ref <18)

## 2022-11-15 MED ORDER — IBUPROFEN 600 MG PO TABS: 600.0000 mg | ORAL_TABLET | Freq: Four times a day (QID) | ORAL | 0 refills | Status: DC | PRN

## 2022-11-15 MED ORDER — METHOCARBAMOL 500 MG PO TABS
500.0000 mg | ORAL_TABLET | Freq: Once | ORAL | Status: AC
  Administered 2022-11-15: 500 mg via ORAL
  Filled 2022-11-15: qty 1

## 2022-11-15 MED ORDER — ACETAMINOPHEN 325 MG PO TABS
650.0000 mg | ORAL_TABLET | Freq: Once | ORAL | Status: AC
  Administered 2022-11-15: 650 mg via ORAL
  Filled 2022-11-15: qty 2

## 2022-11-15 MED ORDER — NAPROXEN 250 MG PO TABS
500.0000 mg | ORAL_TABLET | Freq: Once | ORAL | Status: AC
  Administered 2022-11-15: 500 mg via ORAL
  Filled 2022-11-15: qty 2

## 2022-11-15 MED ORDER — METHOCARBAMOL 500 MG PO TABS: 500.0000 mg | ORAL_TABLET | Freq: Two times a day (BID) | ORAL | 0 refills | Status: AC

## 2022-11-15 NOTE — Discharge Instructions (Addendum)
You are seen in the ER for chest pain.  The workup in the emergency room is negative for heart attack.  X-ray of your chest does not reveal any evidence of lung infection or lung collapse.  Your pain is reproducible and likely because of muscle spasms.  Take the medications that are prescribed for symptom management and complete the exercises we discussed to stretch out your back.  Warm compresses are also helpful.  Avoid heavy lifting with the left upper extremity for the next 2 days.

## 2022-11-15 NOTE — ED Notes (Signed)
Patient transported to X-ray 

## 2022-11-15 NOTE — ED Provider Notes (Signed)
[place] Provider Note   CSN: [id number] Arrival date & time: 11/15/22  0636     History  Chief Complaint  Patient presents with   Chest Pain    Nathaniel Holt is a 32 y.o. male.  HPI     32 year old patient comes in with chief complaint of chest pain.  Patient states that he started noticing chest tightness on the left side yesterday night.  When he woke up this morning the tightness is worse and encompasses the anterior chest and his back as well.  His pain is worse with any kind of movement and also with different positions.  Patient cannot think of any specific injury, but his work requires intermittent heavy moving as well.  He denies any recent URI-like symptoms, there is no cough, shortness of breath.  Pain is worse with deep inspiration.  Patient has no history of PE, DVT.  Review of system is negative for any numbness or tingling.  Home Medications Prior to Admission medications   Medication Sig Start Date End Date Taking? Authorizing Provider  ibuprofen (ADVIL) 600 MG tablet Take 1 tablet (600 mg total) by mouth every 6 (six) hours as needed. 11/15/22  Yes Varney Biles, MD  methocarbamol (ROBAXIN) 500 MG tablet Take 1 tablet (500 mg total) by mouth 2 (two) times daily. 11/15/22  Yes Varney Biles, MD  benzonatate (TESSALON) 200 MG capsule Take 1 capsule (200 mg total) by mouth every 8 (eight) hours. Swallow whole, do not chew 04/18/21   Triplett, Tammy, PA-C  ondansetron (ZOFRAN) 4 MG tablet Take 1 tablet (4 mg total) by mouth every 6 (six) hours. As needed for nausea vomiting 04/18/21   Triplett, Tammy, PA-C      Allergies    Patient has no known allergies.    Review of Systems   Review of Systems  All other systems reviewed and are negative.   Physical Exam Updated Vital Signs BP 114/83   Pulse 63   Temp 98.1 F (36.7 C) (Oral)   Resp 12   Ht '5\' 9"'$  (1.753 m)   Wt 72.6 kg   SpO2 100%   BMI 23.63 kg/m   Physical Exam Vitals and nursing note reviewed.  Constitutional:      Appearance: He is well-developed.  HENT:     Head: Atraumatic.  Cardiovascular:     Rate and Rhythm: Normal rate.  Pulmonary:     Effort: Pulmonary effort is normal.  Chest:     Chest wall: No tenderness or crepitus.     Comments: Patient has left anterior chest wall tenderness diffusely with palpation and also significant discomfort with deep tissue massage of the scapular region on the left side.  Patient also has reproducible tenderness with abduction of the left upper extremity and forward flexion of the left lower extremity. Musculoskeletal:     Cervical back: Neck supple.  Skin:    General: Skin is warm.  Neurological:     Mental Status: He is alert and oriented to person, place, and time.     ED Results / Procedures / Treatments   Labs (all labs ordered are listed, but only abnormal results are displayed) Labs Reviewed  BASIC METABOLIC PANEL - Abnormal; Notable for the following components:      Result Value   Calcium 8.2 (*)    All other components within normal limits  CBC  TROPONIN I (HIGH SENSITIVITY)    EKG EKG Interpretation  Date/Time:  Friday November 15 2022 06:49:08 EST Ventricular Rate:  65 PR Interval:  156 QRS Duration: 78 QT Interval:  366 QTC Calculation: 381 R Axis:   78 Text Interpretation: Sinus rhythm Consider left atrial enlargement ST elev, probable normal early repol pattern No acute changes No significant change since last tracing Confirmed by Varney Biles [phone]) on 11/15/2022 7:20:13 AM  Radiology DG Chest 2 View  Result Date: 11/15/2022 CLINICAL DATA:  32 year old male with history of chest pain. EXAM: CHEST - 2 VIEW COMPARISON:  Chest x-ray 02/10/2020. FINDINGS: Lung volumes are normal. No consolidative airspace disease. No pleural effusions. No pneumothorax. No pulmonary nodule or mass noted. Pulmonary vasculature and the cardiomediastinal silhouette are within  normal limits. IMPRESSION: No radiographic evidence of acute cardiopulmonary disease. Electronically Signed   By: Vinnie Langton M.D.   On: 11/15/2022 07:08    Procedures Procedures    Medications Ordered in ED Medications  naproxen (NAPROSYN) tablet 500 mg (500 mg Oral Given 11/15/22 0741)  acetaminophen (TYLENOL) tablet 650 mg (650 mg Oral Given 11/15/22 0741)  methocarbamol (ROBAXIN) tablet 500 mg (500 mg Oral Given 11/15/22 0740)    ED Course/ Medical Decision Making/ A&P                             Medical Decision Making Amount and/or Complexity of Data Reviewed Labs: ordered. Radiology: ordered.  Risk OTC drugs. Prescription drug management.   32 year old patient comes in with chief complaint of chest pain and back pain.  He has no significant past medical history.  He denies any heavy tobacco use, substance use and has no premature CAD in the family.  Differential diagnosis considered for this patient includes acute coronary syndrome, pericarditis, pneumothorax, chest wall pain, muscle spasm.  Patient's exam is overall reassuring besides the reproducible nature of the discomfort with palpation of the chest wall and back.  X-ray of the chest ordered.  I have independently interpreted the x-ray, there is no evidence of pneumothorax.  Patient's hear score is 0.  Initial high-sensitivity troponin is normal.  Repeat is not indicated given that the pain has been present since last night constantly.  Stable for discharge.  Will put him on NSAID, Tylenol and muscle relaxant.  Stretching exercises discussed.  Warm compresses recommended.  Final Clinical Impression(s) / ED Diagnoses Final diagnoses:  Muscle spasm of back  Chest wall discomfort    Rx / DC Orders ED Discharge Orders          Ordered    ibuprofen (ADVIL) 600 MG tablet  Every 6 hours PRN        11/15/22 0730    methocarbamol (ROBAXIN) 500 MG tablet  2 times daily        11/15/22 0730               Varney Biles, MD 11/15/22 [phone]

## 2022-11-15 NOTE — ED Triage Notes (Signed)
Pt to ED from home c/o left chest pain radiating through to back since yesterday.  Denies cough, SOB, n/v/d.  Denies known injury.  States pain is worse with movement, touching, and moving neck.  Pt A&Ox4, chest rise even and unlabored, skin WNL and in NAD at this time.

## 2022-11-18 ENCOUNTER — Other Ambulatory Visit: Payer: Self-pay

## 2022-11-18 ENCOUNTER — Emergency Department (HOSPITAL_COMMUNITY)
Admission: EM | Admit: 2022-11-18 | Discharge: 2022-11-18 | Disposition: A | Discharge status: Home / Self Care | Payer: Self-pay | Attending: Emergency Medicine | Admitting: Emergency Medicine

## 2022-11-18 DIAGNOSIS — B349 Viral infection, unspecified: Secondary | ICD-10-CM | POA: Insufficient documentation

## 2022-11-18 DIAGNOSIS — Z1152 Encounter for screening for COVID-19: Secondary | ICD-10-CM | POA: Insufficient documentation

## 2022-11-18 DIAGNOSIS — R519 Headache, unspecified: Secondary | ICD-10-CM | POA: Insufficient documentation

## 2022-11-18 DIAGNOSIS — F17210 Nicotine dependence, cigarettes, uncomplicated: Secondary | ICD-10-CM | POA: Insufficient documentation

## 2022-11-18 DIAGNOSIS — J020 Streptococcal pharyngitis: Secondary | ICD-10-CM | POA: Insufficient documentation

## 2022-11-18 LAB — RESP PANEL BY RT-PCR (RSV, FLU A&B, COVID)  RVPGX2
Influenza A by PCR: NEGATIVE
Influenza B by PCR: NEGATIVE
Resp Syncytial Virus by PCR: NEGATIVE
SARS Coronavirus 2 by RT PCR: NEGATIVE

## 2022-11-18 LAB — GROUP A STREP BY PCR: Group A Strep by PCR: DETECTED — AB

## 2022-11-18 MED ORDER — IBUPROFEN 600 MG PO TABS: 600.0000 mg | ORAL_TABLET | Freq: Four times a day (QID) | ORAL | 0 refills | Status: DC | PRN

## 2022-11-18 MED ORDER — HALOPERIDOL LACTATE 5 MG/ML IJ SOLN
5.0000 mg | Freq: Once | INTRAMUSCULAR | Status: AC
  Administered 2022-11-18: 5 mg via INTRAMUSCULAR

## 2022-11-18 MED ORDER — NAPROXEN 250 MG PO TABS
500.0000 mg | ORAL_TABLET | Freq: Once | ORAL | Status: AC
  Administered 2022-11-18: 500 mg via ORAL
  Filled 2022-11-18: qty 2

## 2022-11-18 MED ORDER — HALOPERIDOL LACTATE 5 MG/ML IJ SOLN
2.0000 mg | Freq: Once | INTRAMUSCULAR | Status: DC
  Filled 2022-11-18: qty 1

## 2022-11-18 MED ORDER — PENICILLIN G BENZATHINE 1200000 UNIT/2ML IM SUSY
1.2000 10*6.[IU] | PREFILLED_SYRINGE | Freq: Once | INTRAMUSCULAR | Status: AC
  Administered 2022-11-18: 1.2 10*6.[IU] via INTRAMUSCULAR
  Filled 2022-11-18: qty 2

## 2022-11-18 NOTE — ED Provider Notes (Signed)
[facility] Emergency Department Provider Note MRN:  [id number]  Arrival date & time: 11/18/22     Chief Complaint   Headache   History of Present Illness   Nathaniel Holt is a 32 y.o. year-old male with a history of schizophrenia presenting to the ED with chief complaint of headache.  Headache, sore throat, mild cough, body aches, chills for the past few days.  Sick contacts at home.  Headache is very uncomfortable.  Has happened before.  Gradual onset, no numbness or weakness to the arms or legs.  Review of Systems  A thorough review of systems was obtained and all systems are negative except as noted in the HPI and PMH.   Patient's Health History    Past Medical History:  Diagnosis Date   Bipolar disorder ([place])    Rhabdomyolysis    Schizophrenia ([place])     Past Surgical History:  Procedure Laterality Date   LEG SURGERY      No family history on file.  Social History   Socioeconomic History   Marital status: Single    Spouse name: Not on file   Number of children: Not on file   Years of education: Not on file   Highest education level: Not on file  Occupational History   Not on file  Tobacco Use   Smoking status: Every Day    Packs/day: 1.00    Types: Cigarettes   Smokeless tobacco: Never  Vaping Use   Vaping Use: Never used  Substance and Sexual Activity   Alcohol use: No   Drug use: Yes    Frequency: 3.0 times per week    Types: Marijuana   Sexual activity: Not on file  Other Topics Concern   Not on file  Social History Narrative   Not on file   Social Determinants of Health   Financial Resource Strain: Not on file  Food Insecurity: Not on file  Transportation Needs: Not on file  Physical Activity: Not on file  Stress: Not on file  Social Connections: Not on file  Intimate Partner Violence: Not on file     Physical Exam   Vitals:   11/18/22 0619  BP: 109/88  Pulse: 79  Resp: 14  Temp: 98.5 F (36.9 C)  SpO2:  100%    CONSTITUTIONAL: Well-appearing, NAD NEURO/PSYCH:  Alert and oriented x 3, no focal deficits, no meningismus EYES:  eyes equal and reactive ENT/NECK:  no LAD, no JVD CARDIO: Regular rate, well-perfused, normal S1 and S2 PULM:  CTAB no wheezing or rhonchi GI/GU:  non-distended, non-tender MSK/SPINE:  No gross deformities, no edema SKIN:  no rash, atraumatic   *Additional and/or pertinent findings included in MDM below  Diagnostic and Interventional Summary    EKG Interpretation  Date/Time:    Ventricular Rate:    PR Interval:    QRS Duration:   QT Interval:    QTC Calculation:   R Axis:     Text Interpretation:         Labs Reviewed  RESP PANEL BY RT-PCR (RSV, FLU A&B, COVID)  [id number]  GROUP A STREP BY PCR    No orders to display    Medications  haloperidol lactate (HALDOL) injection 2 mg (has no administration in time range)  naproxen (NAPROSYN) tablet 500 mg (has no administration in time range)     Procedures  /  Critical Care Procedures  ED Course and Medical Decision Making  Initial Impression and Ddx Suspect viral  illness with tension type headache.  Multiple sick contacts at home.  Normal neurological exam, normal and symmetric strength and sensation, normal coordination, normal speech.  No meningismus.  Providing symptomatic management, swabbing for COVID and flu.  Strep throat also considered, had erythema to posterior oropharynx but no signs of more significant contiguous infection  Past medical/surgical history that increases complexity of ED encounter: Schizophrenia  Interpretation of Diagnostics Pending swabs.  Patient Reassessment and Ultimate Disposition/Management     Signed out to oncoming provider at shift change.  Patient management required discussion with the following services or consulting groups:  None  Complexity of Problems Addressed Acute complicated illness or Injury  Additional Data Reviewed and Analyzed Further  history obtained from: None  Additional Factors Impacting ED Encounter Risk None  Barth Kirks. Sedonia Small, MD [place] [email or web address]  Final Clinical Impressions(s) / ED Diagnoses     ICD-10-CM   1. Viral illness  B34.9     2. Bad headache  R51.9       ED Discharge Orders     None        Discharge Instructions Discussed with and Provided to Patient:   Discharge Instructions   None      Maudie Flakes, MD 11/18/22 [phone]

## 2022-11-18 NOTE — ED Provider Notes (Signed)
Pt signed out by Dr. Sedonia Small pending symptomatic improvement.  Pt feels much better.  He was able to rest.  He is + for strep.  He is neg for covid/flu/rsv.  He opts for the Bicillin LA IM injection.  He is stable for d/c.  Return if worse.    Nathaniel Pence, MD 11/18/22 [phone]

## 2022-11-18 NOTE — ED Triage Notes (Signed)
Pt c/o headache, sore throat, cough. Denies any fever at home but states everyone in his house has been sick this week.

## 2023-07-12 ENCOUNTER — Other Ambulatory Visit: Payer: Self-pay

## 2023-07-12 ENCOUNTER — Emergency Department (HOSPITAL_COMMUNITY)
Admission: EM | Admit: 2023-07-12 | Discharge: 2023-07-12 | Disposition: A | Discharge status: Home / Self Care | Payer: 59

## 2023-07-12 ENCOUNTER — Encounter (HOSPITAL_COMMUNITY): Payer: Self-pay

## 2023-07-12 ENCOUNTER — Emergency Department (HOSPITAL_COMMUNITY): Payer: 59

## 2023-07-12 DIAGNOSIS — S62357A Nondisplaced fracture of shaft of fifth metacarpal bone, left hand, initial encounter for closed fracture: Secondary | ICD-10-CM | POA: Diagnosis not present

## 2023-07-12 DIAGNOSIS — W228XXA Striking against or struck by other objects, initial encounter: Secondary | ICD-10-CM | POA: Diagnosis not present

## 2023-07-12 DIAGNOSIS — S6992XA Unspecified injury of left wrist, hand and finger(s), initial encounter: Secondary | ICD-10-CM | POA: Diagnosis present

## 2023-07-12 MED ORDER — IBUPROFEN 400 MG PO TABS
600.0000 mg | ORAL_TABLET | Freq: Once | ORAL | Status: AC
  Administered 2023-07-12: 600 mg via ORAL
  Filled 2023-07-12: qty 2

## 2023-07-12 NOTE — ED Provider Notes (Signed)
[place] EMERGENCY DEPARTMENT AT [facility] Provider Note   CSN: [id number] Arrival date & time: 07/12/23  2122     History  Chief Complaint  Patient presents with   Hand Pain    Nathaniel Holt is a 32 y.o. male presents to ED for evaluation of injury to his left hand.  States that on Thursday a piece of metal was falling so he reached out to catch it and he struck his hand on another piece of metal that was in his shed.  States that he has had persistent pain to the lateral portion of his left hand since this time.  States he took Tylenol yesterday which did alleviate his pain but has not taken any medication today.  Denies any other concerns.   Hand Pain       Home Medications Prior to Admission medications   Medication Sig Start Date End Date Taking? Authorizing Provider  ibuprofen (ADVIL) 600 MG tablet Take 1 tablet (600 mg total) by mouth every 6 (six) hours as needed. 11/15/22   Derwood Kaplan, MD  ibuprofen (ADVIL) 600 MG tablet Take 1 tablet (600 mg total) by mouth every 6 (six) hours as needed. 11/18/22   Jacalyn Lefevre, MD  methocarbamol (ROBAXIN) 500 MG tablet Take 1 tablet (500 mg total) by mouth 2 (two) times daily. 11/15/22   Derwood Kaplan, MD      Allergies    Patient has no known allergies.    Review of Systems   Review of Systems  Musculoskeletal:        Hand pain  All other systems reviewed and are negative.   Physical Exam Updated Vital Signs BP 112/75 (BP Location: Right Arm)   Pulse 86   Temp 98 F (36.7 C)   Resp 16   Ht 5\' 9"  (1.753 m)   Wt 72.6 kg   SpO2 96%   BMI 23.63 kg/m  Physical Exam Vitals and nursing note reviewed.  Constitutional:      General: He is not in acute distress.    Appearance: Normal appearance. He is not ill-appearing, toxic-appearing or diaphoretic.  HENT:     Head: Normocephalic and atraumatic.     Nose: Nose normal.     Mouth/Throat:     Mouth: Mucous membranes are moist.     Pharynx:  Oropharynx is clear.  Eyes:     Extraocular Movements: Extraocular movements intact.     Conjunctiva/sclera: Conjunctivae normal.     Pupils: Pupils are equal, round, and reactive to light.  Cardiovascular:     Rate and Rhythm: Normal rate and regular rhythm.  Pulmonary:     Effort: Pulmonary effort is normal.     Breath sounds: Normal breath sounds. No wheezing.  Abdominal:     General: Abdomen is flat. Bowel sounds are normal.     Palpations: Abdomen is soft.     Tenderness: There is no abdominal tenderness.  Musculoskeletal:     Cervical back: Normal range of motion and neck supple. No tenderness.     Comments: Soft tissue swelling around the base of fifth metacarpal.  No overlying skin change.  Skin:    General: Skin is warm and dry.     Capillary Refill: Capillary refill takes less than 2 seconds.  Neurological:     Mental Status: He is alert and oriented to person, place, and time.     ED Results / Procedures / Treatments   Labs (all labs ordered are listed,  but only abnormal results are displayed) Labs Reviewed - No data to display  EKG None  Radiology DG Hand Complete Left  Result Date: 07/12/2023 CLINICAL DATA:  Left hand pain EXAM: LEFT HAND - COMPLETE 3+ VIEW COMPARISON:  None Available. FINDINGS: Nondisplaced oblique fracture involving the 5th metacarpal shaft. The joint spaces are preserved. Mild soft tissue swelling. IMPRESSION: Nondisplaced oblique fracture involving the 5th metacarpal shaft. Electronically Signed   By: Charline Bills M.D.   On: 07/12/2023 22:00    Procedures Procedures   Medications Ordered in ED Medications  ibuprofen (ADVIL) tablet 600 mg (600 mg Oral Given 07/12/23 2214)    ED Course/ Medical Decision Making/ A&P  Medical Decision Making Amount and/or Complexity of Data Reviewed Radiology: ordered.  Risk Prescription drug management.   32 year old male presents to ED for evaluation of injury to left hand.  Please see HPI  for further details.  X-ray imaging of patient left hand shows nondisplaced oblique fracture involving the fifth metacarpal shaft.  Patient placed in short arm ulnar gutter splint.  Patient will be referred to orthopedics for further management and care.  Patient will be encouraged to take Tylenol and ibuprofen in the interim for pain.  Stable to discharge.   Final Clinical Impression(s) / ED Diagnoses Final diagnoses:  Closed nondisplaced fracture of shaft of fifth metacarpal bone of left hand, initial encounter    Rx / DC Orders ED Discharge Orders     None         Clent Ridges 07/12/23 2227    Durwin Glaze, MD 07/12/23 [phone]

## 2023-07-12 NOTE — Discharge Instructions (Addendum)
It was a pleasure taking part in your care today.  As we discussed, you have a fracture of the shaft of your fifth metacarpal.  Please follow-up with orthopedics, Dr. Yevette Edwards at your earliest convenience.  Please call on Monday and make an appointment to be seen.  Please remain in short arm ulnar gutter splint until seen by orthopedics.  You may take ibuprofen or Tylenol in the interim for pain.  You may also apply ice.

## 2023-07-12 NOTE — ED Triage Notes (Signed)
Pt reports he hit his left hand against some metal on Thursday and it is still very painful.

## 2024-01-27 ENCOUNTER — Emergency Department (HOSPITAL_COMMUNITY)
Admission: EM | Admit: 2024-01-27 | Discharge: 2024-01-28 | Disposition: A | Discharge status: Home / Self Care | Attending: Emergency Medicine | Admitting: Emergency Medicine

## 2024-01-27 ENCOUNTER — Other Ambulatory Visit: Payer: Self-pay

## 2024-01-27 ENCOUNTER — Encounter (HOSPITAL_COMMUNITY): Payer: Self-pay

## 2024-01-27 DIAGNOSIS — R110 Nausea: Secondary | ICD-10-CM | POA: Insufficient documentation

## 2024-01-27 DIAGNOSIS — F17210 Nicotine dependence, cigarettes, uncomplicated: Secondary | ICD-10-CM | POA: Diagnosis not present

## 2024-01-27 DIAGNOSIS — R1084 Generalized abdominal pain: Secondary | ICD-10-CM | POA: Insufficient documentation

## 2024-01-27 LAB — CBC
HCT: 44.2 % (ref 39.0–52.0)
Hemoglobin: 15.1 g/dL (ref 13.0–17.0)
MCH: 31.8 pg (ref 26.0–34.0)
MCHC: 34.2 g/dL (ref 30.0–36.0)
MCV: 93.1 fL (ref 80.0–100.0)
Platelets: 253 10*3/uL (ref 150–400)
RBC: 4.75 MIL/uL (ref 4.22–5.81)
RDW: 13.2 % (ref 11.5–15.5)
WBC: 6.9 10*3/uL (ref 4.0–10.5)
nRBC: 0 % (ref 0.0–0.2)

## 2024-01-27 LAB — LIPASE, BLOOD: Lipase: 302 U/L — ABNORMAL HIGH (ref 11–51)

## 2024-01-27 NOTE — ED Triage Notes (Signed)
 Pt reports generalized abd pain that started yesterday, last BM today and normal per report, pt endorses nausea, no vomiting.

## 2024-01-28 ENCOUNTER — Emergency Department (HOSPITAL_COMMUNITY)

## 2024-01-28 LAB — COMPREHENSIVE METABOLIC PANEL WITH GFR
ALT: 18 U/L (ref 0–44)
AST: 17 U/L (ref 15–41)
Albumin: 4 g/dL (ref 3.5–5.0)
Alkaline Phosphatase: 57 U/L (ref 38–126)
Anion gap: 8 (ref 5–15)
BUN: 6 mg/dL (ref 6–20)
CO2: 25 mmol/L (ref 22–32)
Calcium: 8.7 mg/dL — ABNORMAL LOW (ref 8.9–10.3)
Chloride: 100 mmol/L (ref 98–111)
Creatinine, Ser: 0.87 mg/dL (ref 0.61–1.24)
GFR, Estimated: >60 mL/min (ref >60)
Glucose, Bld: 105 mg/dL — ABNORMAL HIGH (ref 70–99)
Potassium: 3.2 mmol/L — ABNORMAL LOW (ref 3.5–5.1)
Sodium: 137 mmol/L (ref 135–145)
Total Bilirubin: 0.5 mg/dL (ref 0.0–1.2)
Total Protein: 7.4 g/dL (ref 6.5–8.1)

## 2024-01-28 LAB — URINALYSIS, ROUTINE W REFLEX MICROSCOPIC
Bilirubin Urine: NEGATIVE
Glucose, UA: NEGATIVE mg/dL
Hgb urine dipstick: NEGATIVE
Ketones, ur: NEGATIVE mg/dL
Leukocytes,Ua: NEGATIVE
Nitrite: NEGATIVE
Protein, ur: NEGATIVE mg/dL
Specific Gravity, Urine: 1.002 — ABNORMAL LOW (ref 1.005–1.030)
pH: 6 (ref 5.0–8.0)

## 2024-01-28 MED ORDER — MORPHINE SULFATE (PF) 4 MG/ML IV SOLN
4.0000 mg | Freq: Once | INTRAVENOUS | Status: AC
  Administered 2024-01-28: 4 mg via INTRAVENOUS
  Filled 2024-01-28: qty 1

## 2024-01-28 MED ORDER — ONDANSETRON HCL 4 MG/2ML IJ SOLN
4.0000 mg | Freq: Once | INTRAMUSCULAR | Status: AC
  Administered 2024-01-28: 4 mg via INTRAVENOUS
  Filled 2024-01-28: qty 2

## 2024-01-28 MED ORDER — IOHEXOL 300 MG/ML  SOLN
100.0000 mL | Freq: Once | INTRAMUSCULAR | Status: AC | PRN
  Administered 2024-01-28: 100 mL via INTRAVENOUS

## 2024-01-28 MED ORDER — ONDANSETRON 4 MG PO TBDP: 4.0000 mg | ORAL_TABLET | Freq: Three times a day (TID) | ORAL | 0 refills | Status: AC | PRN

## 2024-01-28 MED ORDER — DICYCLOMINE HCL 20 MG PO TABS: 20.0000 mg | ORAL_TABLET | Freq: Two times a day (BID) | ORAL | 0 refills | Status: AC

## 2024-01-28 NOTE — ED Provider Notes (Signed)
 [facility] Emergency Department Provider Note MRN:  [id number]  Arrival date & time: 01/28/24     Chief Complaint   Abdominal Pain   History of Present Illness   Nathaniel LARICCIA is a 33 y.o. year-old male with a history of schizophrenia presenting to the ED with chief complaint of abdominal pain.  Diffuse abdominal pain over the past few days.  Nausea but no vomiting.  Normal bowel movements, no fever.  Pain radiates to the right flank.  Review of Systems  A thorough review of systems was obtained and all systems are negative except as noted in the HPI and PMH.   Patient's Health History    Past Medical History:  Diagnosis Date   Bipolar disorder ([facility])    Rhabdomyolysis    Schizophrenia ([facility])     Past Surgical History:  Procedure Laterality Date   LEG SURGERY      History reviewed. No pertinent family history.  Social History   Socioeconomic History   Marital status: Single    Spouse name: Not on file   Number of children: Not on file   Years of education: Not on file   Highest education level: Not on file  Occupational History   Not on file  Tobacco Use   Smoking status: Every Day    Current packs/day: 1.00    Types: Cigarettes   Smokeless tobacco: Never  Vaping Use   Vaping status: Every Day  Substance and Sexual Activity   Alcohol use: No   Drug use: Not Currently    Frequency: 3.0 times per week    Types: Marijuana   Sexual activity: Not on file  Other Topics Concern   Not on file  Social History Narrative   Not on file   Social Drivers of Health   Financial Resource Strain: Not on file  Food Insecurity: Not on file  Transportation Needs: Not on file  Physical Activity: Not on file  Stress: Not on file  Social Connections: Not on file  Intimate Partner Violence: Not on file     Physical Exam   Vitals:   01/27/24 1922 01/28/24 0217  BP: 123/83 105/60  Pulse: 67 61  Resp: 18 18  Temp: 98.6 F (37 C) (!) 97.5 F  (36.4 C)  SpO2: 100% 96%    CONSTITUTIONAL: Well-appearing, NAD NEURO/PSYCH:  Alert and oriented x 3, no focal deficits EYES:  eyes equal and reactive ENT/NECK:  no LAD, no JVD CARDIO: Regular rate, well-perfused, normal S1 and S2 PULM:  CTAB no wheezing or rhonchi GI/GU:  non-distended, mild diffuse tenderness MSK/SPINE:  No gross deformities, no edema SKIN:  no rash, atraumatic   *Additional and/or pertinent findings included in MDM below  Diagnostic and Interventional Summary    EKG Interpretation Date/Time:    Ventricular Rate:    PR Interval:    QRS Duration:    QT Interval:    QTC Calculation:   R Axis:      Text Interpretation:         Labs Reviewed  LIPASE, BLOOD - Abnormal; Notable for the following components:      Result Value   Lipase 302 (*)    All other components within normal limits  COMPREHENSIVE METABOLIC PANEL WITH GFR - Abnormal; Notable for the following components:   Potassium 3.2 (*)    Glucose, Bld 105 (*)    Calcium 8.7 (*)    All other components within normal limits  URINALYSIS, ROUTINE  W REFLEX MICROSCOPIC - Abnormal; Notable for the following components:   Color, Urine STRAW (*)    Specific Gravity, Urine 1.002 (*)    All other components within normal limits  CBC    CT ABDOMEN PELVIS W CONTRAST  Final Result      Medications  morphine (PF) 4 MG/ML injection 4 mg (4 mg Intravenous Given 01/28/24 0153)  ondansetron  (ZOFRAN ) injection 4 mg (4 mg Intravenous Given 01/28/24 0153)  iohexol (OMNIPAQUE) 300 MG/ML solution 100 mL (100 mLs Intravenous Contrast Given 01/28/24 0206)     Procedures  /  Critical Care Procedures  ED Course and Medical Decision Making  Initial Impression and Ddx Differential diagnosis includes appendicitis, diverticulitis, kidney stone, pyelonephritis, gastroenteritis, SBO.  Past medical/surgical history that increases complexity of ED encounter: None  Interpretation of Diagnostics I personally reviewed  the Laboratory Testing and my interpretation is as follows: No significant blood count or electrolyte disturbance.  CT unremarkable.  No radiographic signs of pancreatitis.  Patient Reassessment and Ultimate Disposition/Management     Patient resting comfortably on reassessment with normal vitals, no indication for further testing or admission.  Appropriate for discharge.  Patient management required discussion with the following services or consulting groups:  None  Complexity of Problems Addressed Acute illness or injury that poses threat of life of bodily function  Additional Data Reviewed and Analyzed Further history obtained from: Further history from spouse/family member  Additional Factors Impacting ED Encounter Risk Prescriptions and Use of parenteral controlled substances  Autum Benfer M. Harless Lien, MD [facility] Emergency Medicine [facility] [email or web address]  Final Clinical Impressions(s) / ED Diagnoses     ICD-10-CM   1. Generalized abdominal pain  R10.84       ED Discharge Orders          Ordered    dicyclomine (BENTYL) 20 MG tablet  2 times daily        01/28/24 1610    ondansetron  (ZOFRAN -ODT) 4 MG disintegrating tablet  Every 8 hours PRN        01/28/24 9604             Discharge Instructions Discussed with and Provided to Patient:     Discharge Instructions      You were evaluated in the Emergency Department and after careful evaluation, we did not find any emergent condition requiring admission or further testing in the hospital.  Your exam/testing today is overall reassuring.  Symptoms may be due to a stomach bug.  Can use the Zofran  as needed for nausea, can use the Bentyl as needed for crampy abdominal pain, take it easy with your diet over the next few days as we discussed.  Please return to the Emergency Department if you experience any worsening of your condition.   Thank you for allowing us  to be a part of your care.      Edson Graces, MD 01/28/24 [phone]

## 2024-01-28 NOTE — Discharge Instructions (Signed)
 You were evaluated in the Emergency Department and after careful evaluation, we did not find any emergent condition requiring admission or further testing in the hospital.  Your exam/testing today is overall reassuring.  Symptoms may be due to a stomach bug.  Can use the Zofran  as needed for nausea, can use the Bentyl as needed for crampy abdominal pain, take it easy with your diet over the next few days as we discussed.  Please return to the Emergency Department if you experience any worsening of your condition.   Thank you for allowing us  to be a part of your care.

## 2024-02-01 ENCOUNTER — Emergency Department (HOSPITAL_COMMUNITY)
Admission: EM | Admit: 2024-02-01 | Discharge: 2024-02-01 | Disposition: A | Discharge status: Home / Self Care | Attending: Emergency Medicine | Admitting: Emergency Medicine

## 2024-02-01 ENCOUNTER — Other Ambulatory Visit: Payer: Self-pay

## 2024-02-01 ENCOUNTER — Encounter (HOSPITAL_COMMUNITY): Payer: Self-pay

## 2024-02-01 DIAGNOSIS — R1084 Generalized abdominal pain: Secondary | ICD-10-CM | POA: Diagnosis present

## 2024-02-01 DIAGNOSIS — K2900 Acute gastritis without bleeding: Secondary | ICD-10-CM | POA: Diagnosis not present

## 2024-02-01 LAB — COMPREHENSIVE METABOLIC PANEL WITH GFR
ALT: 19 U/L (ref 0–44)
AST: 19 U/L (ref 15–41)
Albumin: 4 g/dL (ref 3.5–5.0)
Alkaline Phosphatase: 66 U/L (ref 38–126)
Anion gap: 4 — ABNORMAL LOW (ref 5–15)
BUN: 8 mg/dL (ref 6–20)
CO2: 25 mmol/L (ref 22–32)
Calcium: 8.5 mg/dL — ABNORMAL LOW (ref 8.9–10.3)
Chloride: 106 mmol/L (ref 98–111)
Creatinine, Ser: 0.77 mg/dL (ref 0.61–1.24)
GFR, Estimated: >60 mL/min (ref >60)
Glucose, Bld: 118 mg/dL — ABNORMAL HIGH (ref 70–99)
Potassium: 3.3 mmol/L — ABNORMAL LOW (ref 3.5–5.1)
Sodium: 135 mmol/L (ref 135–145)
Total Bilirubin: 0.8 mg/dL (ref 0.0–1.2)
Total Protein: 7.2 g/dL (ref 6.5–8.1)

## 2024-02-01 LAB — CBC WITH DIFFERENTIAL/PLATELET
Abs Immature Granulocytes: 0.01 10*3/uL (ref 0.00–0.07)
Basophils Absolute: 0 10*3/uL (ref 0.0–0.1)
Basophils Relative: 1 %
Eosinophils Absolute: 0.1 10*3/uL (ref 0.0–0.5)
Eosinophils Relative: 3 %
HCT: 41.2 % (ref 39.0–52.0)
Hemoglobin: 14.3 g/dL (ref 13.0–17.0)
Immature Granulocytes: 0 %
Lymphocytes Relative: 42 %
Lymphs Abs: 1.9 10*3/uL (ref 0.7–4.0)
MCH: 32.3 pg (ref 26.0–34.0)
MCHC: 34.7 g/dL (ref 30.0–36.0)
MCV: 93 fL (ref 80.0–100.0)
Monocytes Absolute: 0.4 10*3/uL (ref 0.1–1.0)
Monocytes Relative: 8 %
Neutro Abs: 2.1 10*3/uL (ref 1.7–7.7)
Neutrophils Relative %: 46 %
Platelets: 271 10*3/uL (ref 150–400)
RBC: 4.43 MIL/uL (ref 4.22–5.81)
RDW: 13.3 % (ref 11.5–15.5)
WBC: 4.6 10*3/uL (ref 4.0–10.5)
nRBC: 0 % (ref 0.0–0.2)

## 2024-02-01 LAB — URINALYSIS, W/ REFLEX TO CULTURE (INFECTION SUSPECTED)
Bacteria, UA: NONE SEEN
Bilirubin Urine: NEGATIVE
Glucose, UA: NEGATIVE mg/dL
Hgb urine dipstick: NEGATIVE
Ketones, ur: NEGATIVE mg/dL
Leukocytes,Ua: NEGATIVE
Nitrite: NEGATIVE
Protein, ur: NEGATIVE mg/dL
Specific Gravity, Urine: 1.012 (ref 1.005–1.030)
pH: 7 (ref 5.0–8.0)

## 2024-02-01 LAB — LIPASE, BLOOD: Lipase: 33 U/L (ref 11–51)

## 2024-02-01 MED ORDER — SUCRALFATE 1 G PO TABS: 1.0000 g | ORAL_TABLET | Freq: Three times a day (TID) | ORAL | 0 refills | Status: AC

## 2024-02-01 MED ORDER — PANTOPRAZOLE SODIUM 40 MG PO TBEC: 40.0000 mg | DELAYED_RELEASE_TABLET | Freq: Every day | ORAL | 0 refills | Status: AC

## 2024-02-01 MED ORDER — LACTATED RINGERS IV BOLUS
1000.0000 mL | Freq: Once | INTRAVENOUS | Status: AC
  Administered 2024-02-01: 1000 mL via INTRAVENOUS

## 2024-02-01 MED ORDER — ONDANSETRON HCL 4 MG/2ML IJ SOLN
4.0000 mg | Freq: Once | INTRAMUSCULAR | Status: AC
  Administered 2024-02-01: 4 mg via INTRAVENOUS
  Filled 2024-02-01: qty 2

## 2024-02-01 MED ORDER — MORPHINE SULFATE (PF) 4 MG/ML IV SOLN
4.0000 mg | Freq: Once | INTRAVENOUS | Status: AC
  Administered 2024-02-01: 4 mg via INTRAVENOUS
  Filled 2024-02-01: qty 1

## 2024-02-01 NOTE — ED Triage Notes (Signed)
 Pt c/o generalized abdominal pain that awoke him from his sleep. Endorses SOB and shakes.

## 2024-02-01 NOTE — ED Notes (Signed)
 Pt called out for the nurse. This nurse came in to check on the pt. Pt stated the pain is getting worse by the minute. Pts eyes looked red and tearful. He was sitting on the edge of the bed with arms wrapped around his torso and hunched over. This nurse told him that most doctors like to assess the pts before they order pain medicine and to be patient since there is only one provider right now. Pt nodded his head in understanding. At this time there is no provider assigned to this pt.

## 2024-02-01 NOTE — ED Notes (Signed)
 This nurse made the pt aware that the MD would like a urine sample and asked if the pt would be able to pee at this time. The pt said maybe after having some of the fluids. This nurse told the pt to let the staff know when he needs to use the restroom so we could get a sample. Pt verbalized understanding

## 2024-02-01 NOTE — ED Provider Notes (Signed)
 [place] EMERGENCY DEPARTMENT AT [facility]  Provider Note  CSN: [id number] Arrival date & time: 02/01/24 8295  History Chief Complaint  Patient presents with   Abdominal Pain    Nathaniel Holt is a 33 y.o. male with history of schizophrenia brought by mother for re-evaluation of abdominal pain. He reports pain comes and goes, mostly in epigastric area, but sometimes in suprapubic area. Associated with nausea but no vomiting. He was seen in the ED for similar on 5/20 and had reassuring workup including labs and CT. He was given Rx for bentyl.    Home Medications Prior to Admission medications   Medication Sig Start Date End Date Taking? Authorizing Provider  pantoprazole (PROTONIX) 40 MG tablet Take 1 tablet (40 mg total) by mouth daily. 02/01/24  Yes Charmayne Cooper, MD  sucralfate (CARAFATE) 1 g tablet Take 1 tablet (1 g total) by mouth 4 (four) times daily -  with meals and at bedtime for 10 days. 02/01/24 02/11/24 Yes Charmayne Cooper, MD  dicyclomine (BENTYL) 20 MG tablet Take 1 tablet (20 mg total) by mouth 2 (two) times daily. 01/28/24   Edson Graces, MD  methocarbamol  (ROBAXIN ) 500 MG tablet Take 1 tablet (500 mg total) by mouth 2 (two) times daily. 11/15/22   Deatra Face, MD  ondansetron  (ZOFRAN -ODT) 4 MG disintegrating tablet Take 1 tablet (4 mg total) by mouth every 8 (eight) hours as needed for nausea or vomiting. 01/28/24   Edson Graces, MD     Allergies    Patient has no known allergies.   Review of Systems   Review of Systems Please see HPI for pertinent positives and negatives  Physical Exam BP 118/78   Pulse 67   Temp (!) 97.5 F (36.4 C)   Resp 18   SpO2 96%   Physical Exam Vitals and nursing note reviewed.  Constitutional:      Appearance: Normal appearance.  HENT:     Head: Normocephalic and atraumatic.     Nose: Nose normal.     Mouth/Throat:     Mouth: Mucous membranes are moist.  Eyes:     Extraocular Movements:  Extraocular movements intact.     Conjunctiva/sclera: Conjunctivae normal.  Cardiovascular:     Rate and Rhythm: Normal rate.  Pulmonary:     Effort: Pulmonary effort is normal.     Breath sounds: Normal breath sounds.  Abdominal:     General: Abdomen is flat.     Palpations: Abdomen is soft.     Tenderness: There is abdominal tenderness (diffuse). There is no guarding.  Musculoskeletal:        General: No swelling. Normal range of motion.     Cervical back: Neck supple.  Skin:    General: Skin is warm and dry.  Neurological:     General: No focal deficit present.     Mental Status: He is alert.  Psychiatric:        Mood and Affect: Mood normal.     ED Results / Procedures / Treatments   EKG None  Procedures Procedures  Medications Ordered in the ED Medications  ondansetron  (ZOFRAN ) injection 4 mg (4 mg Intravenous Given 02/01/24 0333)  morphine (PF) 4 MG/ML injection 4 mg (4 mg Intravenous Given 02/01/24 0332)  lactated ringers  bolus 1,000 mL (0 mLs Intravenous Stopped 02/01/24 0435)    Initial Impression and Plan  Patient here with continued abdominal pain, abdomen is benign and recent workup was reassuring.  Will check labs, give pain/nausea meds and IVF for comfort.   ED Course   Clinical Course as of 02/01/24 0506  Sun Feb 01, 2024  0354 CBC is normal.  [CS]  0404 CMP and lipase are unremarkable.  [CS]  0451 UA is clear.  [CS]  0503 Patient sleeping soundly, no further vomiting. Labs are unchanged from previous. Doubt utility in repeat CT imaging. Given location of symptoms, suspect gastritis vs PUD. Will plan discharge with Rx for PPI, carafate and GI referral if symptoms continue.  [CS]    Clinical Course User Index [CS] Charmayne Cooper, MD     MDM Rules/Calculators/A&P Medical Decision Making Problems Addressed: Acute gastritis without hemorrhage, unspecified gastritis type: acute illness or injury  Amount and/or Complexity of Data Reviewed Labs:  ordered. Decision-making details documented in ED Course.  Risk Prescription drug management.     Final Clinical Impression(s) / ED Diagnoses Final diagnoses:  Acute gastritis without hemorrhage, unspecified gastritis type    Rx / DC Orders ED Discharge Orders          Ordered    pantoprazole (PROTONIX) 40 MG tablet  Daily        02/01/24 0505    sucralfate (CARAFATE) 1 g tablet  3 times daily with meals & bedtime        02/01/24 0505             Charmayne Cooper, MD 02/01/24 [phone]

## 2024-07-14 ENCOUNTER — Encounter (HOSPITAL_COMMUNITY): Payer: Self-pay

## 2024-07-14 ENCOUNTER — Other Ambulatory Visit: Payer: Self-pay

## 2024-07-14 ENCOUNTER — Emergency Department (HOSPITAL_COMMUNITY)
Admission: EM | Admit: 2024-07-14 | Discharge: 2024-07-14 | Disposition: A | Discharge status: Home / Self Care | Attending: Emergency Medicine | Admitting: Emergency Medicine

## 2024-07-14 DIAGNOSIS — K0889 Other specified disorders of teeth and supporting structures: Secondary | ICD-10-CM | POA: Diagnosis present

## 2024-07-14 DIAGNOSIS — K047 Periapical abscess without sinus: Secondary | ICD-10-CM | POA: Insufficient documentation

## 2024-07-14 MED ORDER — IBUPROFEN 400 MG PO TABS
600.0000 mg | ORAL_TABLET | Freq: Once | ORAL | Status: AC
Start: 2024-07-14 — End: 2024-07-14
  Administered 2024-07-14: 600 mg via ORAL
  Filled 2024-07-14: qty 2

## 2024-07-14 MED ORDER — HYDROCODONE-ACETAMINOPHEN 5-325 MG PO TABS: 1.0000 | ORAL_TABLET | Freq: Four times a day (QID) | ORAL | 0 refills | Status: AC | PRN

## 2024-07-14 MED ORDER — IBUPROFEN 600 MG PO TABS: 600.0000 mg | ORAL_TABLET | Freq: Four times a day (QID) | ORAL | 0 refills | Status: AC | PRN

## 2024-07-14 MED ORDER — LIDOCAINE-EPINEPHRINE (PF) 1 %-1:200000 IJ SOLN
30.0000 mL | Freq: Once | INTRAMUSCULAR | Status: AC
  Administered 2024-07-14: 30 mL
  Filled 2024-07-14: qty 30

## 2024-07-14 MED ORDER — AMOXICILLIN-POT CLAVULANATE 875-125 MG PO TABS: 1.0000 | ORAL_TABLET | Freq: Two times a day (BID) | ORAL | 0 refills | Status: AC

## 2024-07-14 MED ORDER — AMOXICILLIN-POT CLAVULANATE 875-125 MG PO TABS
1.0000 | ORAL_TABLET | Freq: Once | ORAL | Status: AC
  Administered 2024-07-14: 1 via ORAL
  Filled 2024-07-14: qty 1

## 2024-07-14 MED ORDER — CHLORHEXIDINE GLUCONATE 0.12 % MT SOLN: 15.0000 mL | Freq: Two times a day (BID) | OROMUCOSAL | 0 refills | Status: AC

## 2024-07-14 NOTE — Discharge Instructions (Addendum)
 You were seen in the emergency department today for a dental abscess, we drained the abscess and started you on antibiotics.  I am also prescribing antiseptic rinse.  Take the ibuprofen  as needed for pain, if pain is more severe and not controlled well with the ibuprofen  you can take the hydrocodone.  Do not drink alcohol or take this with other medications that can cause you to be sleepy, do not drive when taking this medication.  Back to the ER if you have any new or worsening symptoms, especially if you have trouble swallowing or breathing.  Avoid salty or spicy foods for the next couple days.  Is very important for you to follow-up with dentist.  If you do not have your own dentist, you can follow-up with the health department or Dr. Bonnell.

## 2024-07-14 NOTE — ED Provider Notes (Signed)
[place] EMERGENCY DEPARTMENT AT [facility] Provider Note   CSN: [id number] Arrival date & time: 07/14/24  1533     Patient presents with: Dental Pain   Nathaniel Holt is a 33 y.o. male.  Striae of bipolar disorder and schizophrenia.  Presents the ER today complaining of left upper dental pain.  States he has been having pain about a month since he chipped a tooth, and states that he now has an abscess that has been present for 2 to 3 days and is causing increased pain.  No trouble swallowing or breathing but he states it is too painful to eat.  He denies fevers or chills.    Dental Pain      Prior to Admission medications   Medication Sig Start Date End Date Taking? Authorizing Provider  amoxicillin-clavulanate (AUGMENTIN) 875-125 MG tablet Take 1 tablet by mouth every 12 (twelve) hours. 07/14/24  Yes Shateka Petrea A, PA-C  chlorhexidine (PERIDEX) 0.12 % solution Use as directed 15 mLs in the mouth or throat 2 (two) times daily. 07/14/24  Yes Maleiyah Releford A, PA-C  HYDROcodone-acetaminophen  (NORCO/VICODIN) 5-325 MG tablet Take 1 tablet by mouth every 6 (six) hours as needed. 07/14/24  Yes Haelyn Forgey A, PA-C  ibuprofen  (ADVIL ) 600 MG tablet Take 1 tablet (600 mg total) by mouth every 6 (six) hours as needed. 07/14/24  Yes Natoya Viscomi A, PA-C  dicyclomine  (BENTYL ) 20 MG tablet Take 1 tablet (20 mg total) by mouth 2 (two) times daily. 01/28/24   Theadore Ozell HERO, MD  methocarbamol  (ROBAXIN ) 500 MG tablet Take 1 tablet (500 mg total) by mouth 2 (two) times daily. 11/15/22   Charlyn Sora, MD  ondansetron  (ZOFRAN -ODT) 4 MG disintegrating tablet Take 1 tablet (4 mg total) by mouth every 8 (eight) hours as needed for nausea or vomiting. 01/28/24   Theadore Ozell HERO, MD  pantoprazole  (PROTONIX ) 40 MG tablet Take 1 tablet (40 mg total) by mouth daily. 02/01/24   Roselyn Carlin NOVAK, MD  sucralfate  (CARAFATE ) 1 g tablet Take 1 tablet (1 g total) by mouth 4 (four) times daily -   with meals and at bedtime for 10 days. 02/01/24 02/11/24  Roselyn Carlin NOVAK, MD    Allergies: Patient has no known allergies.    Review of Systems  Updated Vital Signs BP 120/89 (BP Location: Right Arm)   Pulse (!) 58   Temp 98.8 F (37.1 C) (Oral)   Resp 16   Ht 5' 9 (1.753 m)   Wt 72.6 kg   SpO2 100%   BMI 23.63 kg/m   Physical Exam Vitals and nursing note reviewed.  Constitutional:      General: He is not in acute distress.    Appearance: He is well-developed.  HENT:     Head: Normocephalic and atraumatic.     Mouth/Throat:     Mouth: Mucous membranes are moist.  Eyes:     Conjunctiva/sclera: Conjunctivae normal.  Cardiovascular:     Rate and Rhythm: Normal rate and regular rhythm.     Heart sounds: No murmur heard. Pulmonary:     Effort: Pulmonary effort is normal. No respiratory distress.     Breath sounds: Normal breath sounds.  Abdominal:     Palpations: Abdomen is soft.     Tenderness: There is no abdominal tenderness.  Musculoskeletal:        General: No swelling.     Cervical back: Neck supple.  Skin:    General: Skin is warm  and dry.     Capillary Refill: Capillary refill takes less than 2 seconds.  Neurological:     General: No focal deficit present.     Mental Status: He is alert and oriented to person, place, and time.  Psychiatric:        Mood and Affect: Mood normal.     (all labs ordered are listed, but only abnormal results are displayed) Labs Reviewed - No data to display  EKG: None  Radiology: No results found.   .Incision and Drainage  Date/Time: 07/14/2024 5:54 PM  Performed by: Suellen Sherran LABOR, PA-C Authorized by: Suellen Sherran LABOR, PA-C   Consent:    Consent obtained:  Verbal   Consent given by:  Patient   Risks discussed:  Bleeding, incomplete drainage, pain, infection and damage to other organs Universal protocol:    Procedure explained and questions answered to patient or proxy's satisfaction: yes     Patient  identity confirmed:  Verbally with patient Location:    Type:  Abscess   Location:  Mouth   Mouth location:  Palate Anesthesia:    Anesthesia method:  Local infiltration   Local anesthetic:  Lidocaine  1% WITH epi Procedure type:    Complexity:  Simple Procedure details:    Incision types:  Single straight   Drainage:  Purulent   Wound treatment:  Wound left open Post-procedure details:    Procedure completion:  Tolerated well, no immediate complications    Medications Ordered in the ED  lidocaine -EPINEPHrine (PF) (XYLOCAINE -EPINEPHrine) 1 %-1:200000 (PF) injection 30 mL (30 mLs Infiltration Given 07/14/24 1737)  amoxicillin-clavulanate (AUGMENTIN) 875-125 MG per tablet 1 tablet (1 tablet Oral Given 07/14/24 1738)  ibuprofen  (ADVIL ) tablet 600 mg (600 mg Oral Given 07/14/24 1738)                                    Medical Decision Making Risk Prescription drug management.   Here with dental abscess-reports he broke his tooth about a month ago for the past 2 days has had swelling and has developed an abscess. Tooth #13 is broken, on the palate adjacent to this tooth there is approximately 2 cm fluctuant abscess which was incised and drained.  Patient had some improvement of his pain after drainage he is given Augmentin, Advil  for pain as well.  Will discharge with dental follow-up, Augmentin, Peridex rinse.  He was given strict return precautions.  He is speaking in full clear sentences, nasal secretions well he has no facial swelling and no swelling outside of the localized abscess.  He is agreeable to plan of care and discharge.    Final diagnoses:  Dental abscess    ED Discharge Orders          Ordered    amoxicillin-clavulanate (AUGMENTIN) 875-125 MG tablet  Every 12 hours        07/14/24 1748    chlorhexidine (PERIDEX) 0.12 % solution  2 times daily        07/14/24 1749    HYDROcodone-acetaminophen  (NORCO/VICODIN) 5-325 MG tablet  Every 6 hours PRN        07/14/24 1753     ibuprofen  (ADVIL ) 600 MG tablet  Every 6 hours PRN        07/14/24 1753               Suellen Sherran LABOR, PA-C 07/14/24 1756    Yolande Lamar BROCKS, MD 07/19/24  1509  

## 2024-07-14 NOTE — ED Notes (Signed)
 ED Provider at bedside.

## 2024-07-14 NOTE — ED Triage Notes (Signed)
 Pt arrived via POV c/o left upper dental pain and infection from a chipped tooth. Pt reports he was eating chips when his tooth broke. Pt reports trying Orajel and OTC medication w/o relief.
